# Patient Record
Sex: Female | Born: 2000 | Race: White | Hispanic: No | Marital: Single | Smoking: Never smoker
Health system: Southern US, Community
[De-identification: ages and names within clinical notes are randomized; demographics above are authoritative.]

## PROBLEM LIST (undated history)

## (undated) DIAGNOSIS — D649 Anemia, unspecified: Secondary | ICD-10-CM

## (undated) DIAGNOSIS — K219 Gastro-esophageal reflux disease without esophagitis: Secondary | ICD-10-CM

## (undated) DIAGNOSIS — F329 Major depressive disorder, single episode, unspecified: Secondary | ICD-10-CM

## (undated) DIAGNOSIS — F429 Obsessive-compulsive disorder, unspecified: Secondary | ICD-10-CM

## (undated) DIAGNOSIS — F909 Attention-deficit hyperactivity disorder, unspecified type: Secondary | ICD-10-CM

## (undated) DIAGNOSIS — F32A Depression, unspecified: Secondary | ICD-10-CM

## (undated) DIAGNOSIS — F419 Anxiety disorder, unspecified: Secondary | ICD-10-CM

## (undated) DIAGNOSIS — G4700 Insomnia, unspecified: Secondary | ICD-10-CM

---
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
## 2019-06-17 ENCOUNTER — Other Ambulatory Visit: Payer: Self-pay

## 2019-06-17 ENCOUNTER — Encounter: Payer: Self-pay | Admitting: Plastic Surgery

## 2019-06-17 ENCOUNTER — Ambulatory Visit (INDEPENDENT_AMBULATORY_CARE_PROVIDER_SITE_OTHER): Payer: 59 | Admitting: Plastic Surgery

## 2019-06-17 VITALS — BP 117/77 | HR 73 | Temp 97.5°F | Ht 67.0 in | Wt 175.8 lb

## 2019-06-17 DIAGNOSIS — N62 Hypertrophy of breast: Secondary | ICD-10-CM

## 2019-06-17 NOTE — Progress Notes (Signed)
   Referring Provider Antonietta Barcelona., MD [address],  [place] [id number]   CC:  Chief Complaint  Patient presents with  . Advice Only    (B) breast reduction      Donna Leon is an 18 y.o. female.  HPI: Patient presents to discuss breast reduction.  She has had back pain and neck pain for years.  These have not been this has not been relieved by anti-inflammatories.  She has rashes beneath her breast that she treats with powder.  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  She is around a size H now and wants to be a B or C cup.  Allergies  Allergen Reactions  . Latex Other (See Comments)    Bandage-Rash    Outpatient Encounter Medications as of 06/17/2019  Medication Sig  . buPROPion (WELLBUTRIN SR) 100 MG 12 hr tablet Take 100 mg by mouth every morning.  . clonazePAM (KLONOPIN) 0.5 MG tablet Take 0.5 mg by mouth daily as needed.  . FEROSUL 325 (65 Fe) MG tablet Take 325 mg by mouth daily.  . mirtazapine (REMERON) 15 MG tablet Take 7.5 mg by mouth at bedtime.  Marland Kitchen NYAMYC powder As directed.  Marland Kitchen omeprazole (PRILOSEC) 40 MG capsule Take 1 capsule by mouth daily.  Marland Kitchen OVER THE COUNTER MEDICATION B12-Take 1 tablet by mouth daily.  . sertraline (ZOLOFT) 50 MG tablet Take 1 tablet by mouth daily.   No facility-administered encounter medications on file as of 06/17/2019.      Past medical history significant for anxiety and depression.  Past surgical history negative.  Social History   Social History Narrative  . Not on file  Denies tobacco or illicit drug use  Review of Systems General: Denies fevers, chills, weight loss CV: Denies chest pain, shortness of breath, palpitations  Physical Exam Vitals with BMI 06/17/2019  Height 5\' 7"   Weight 175 lbs 13 oz  BMI 44.31  Systolic 540  Diastolic 77  Pulse 73    General:  No acute distress,  Alert and oriented, Non-Toxic, Normal speech and  affect Breast: She has grade 2 ptosis.  Sternal notch to nipple is 31 on the right side and 32 on the left side.  Nipple to fold is 16 on the right side and 17 on the left side.  She has no scars or masses.  The left side may be slightly bigger.  Assessment/Plan The patient has bilateral symptomatic macromastia.  She is a good candidate for a breast reduction.  The details of breast reduction surgery were discussed.  I explained the procedure in detail along the with the expected scars.  The risks were discussed in detail and include bleeding, infection, damage to surrounding structures, need for additional procedures, nipple loss, change in nipple sensation, persistent pain, contour irregularities and asymmetries.  I explained that breast feeding is often not possible after breast reduction surgery.  We discussed the expected postoperative course with an overall recovery period of about 1 month.  She demonstrated full understanding of all risks.    I anticipate approximately 600 g of tissue removed from each side.  Cindra Presume 06/17/2019, 5:52 PM

## 2019-10-09 ENCOUNTER — Other Ambulatory Visit: Payer: Self-pay

## 2019-10-09 ENCOUNTER — Telehealth: Payer: Self-pay

## 2019-10-09 DIAGNOSIS — Z524 Kidney donor: Secondary | ICD-10-CM

## 2019-10-09 NOTE — Telephone Encounter (Signed)
RECIPIENT: Milon Dikes DOB: 08/04/1979   RECIPIENT MRN: [id number]   RECIPIENT ABO: A+   PRA: POSITIVE   KIDNEY DISEASE:        DONOR ABO: A+   RELATIONSHIP: Fathers Cousin   HOW DONOR LEARNED OF NEED:        DONOR SOCIAL SECURITY #:    HEALTH INSURANCE: Yes   EMPLOYMENT: Full Time Student Religous studies and anthropology   PCP: Getting new PCP; had pediatrician   MARITAL STATUS: single   RACE: white       WT 180 lbs   HT: 5'7"   BMI: 28.2       HTN: no   DM: no   CANCER: no   RECENT TATTOOS: (3 mos) no   TOBACCO: no   RECREATIONAL DRUGS: no   ALCOHOL (# per week): no       LAST PHYSICAL: UTD   MAMMOGRAM: n/a   PAP SMEAR: n/a   COLON: n/a   OB/GYN: no       PSH:  Upper endoscopy r/t gall stones   PMH: Acid reflux;    MENTAL HEALTH: Anxiety; depression, insomnia, ADHD, OCD   HOSPITALIZATIONS:    MEDICATIONS: B12, iron, BC implant, mirtazapine, omeprazole, fluoxetine, adderall,    ALLERGIES: NKDA; latex irritation   HEPATITIS: no   UTI: no   PYELO: no   RENAL CALCULI: no   BLEEDING DISORDER; anemia   TRANSFUSIONS; no       MISC:        TRANSPLANT PSYCH: YES   REASON: Age, Mental Health       NOTES: SCREEN-No HLA d/t recipient inpatient    Mailed donor labs to:  [place]  [place], [place]

## 2019-10-17 ENCOUNTER — Telehealth: Payer: Self-pay

## 2019-10-17 NOTE — Telephone Encounter (Signed)
Potential donor was diagnosed for Covid and has been released from [facility]. She was told her TSH levels may be elevated and she was wondering if that would interfer with her doing the screening labs, writer checked with Inetta Fermo and she was told no she is good to go.

## 2019-11-04 ENCOUNTER — Encounter (HOSPITAL_COMMUNITY)
Admission: EM | Disposition: A | Discharge status: Home / Self Care | Payer: Self-pay | Source: Home / Self Care | Attending: Emergency Medicine

## 2019-11-04 ENCOUNTER — Observation Stay (HOSPITAL_COMMUNITY)
Admission: EM | Admit: 2019-11-04 | Discharge: 2019-11-06 | Disposition: A | Discharge status: Home / Self Care | Payer: 59 | Attending: Internal Medicine | Admitting: Internal Medicine

## 2019-11-04 ENCOUNTER — Emergency Department (HOSPITAL_COMMUNITY): Payer: 59 | Admitting: Certified Registered Nurse Anesthetist

## 2019-11-04 ENCOUNTER — Other Ambulatory Visit: Payer: Self-pay

## 2019-11-04 ENCOUNTER — Encounter (HOSPITAL_COMMUNITY): Payer: Self-pay

## 2019-11-04 ENCOUNTER — Emergency Department (HOSPITAL_COMMUNITY): Payer: 59

## 2019-11-04 DIAGNOSIS — K228 Other specified diseases of esophagus: Secondary | ICD-10-CM | POA: Diagnosis not present

## 2019-11-04 DIAGNOSIS — F329 Major depressive disorder, single episode, unspecified: Secondary | ICD-10-CM | POA: Insufficient documentation

## 2019-11-04 DIAGNOSIS — K2970 Gastritis, unspecified, without bleeding: Secondary | ICD-10-CM | POA: Diagnosis not present

## 2019-11-04 DIAGNOSIS — K2100 Gastro-esophageal reflux disease with esophagitis, without bleeding: Secondary | ICD-10-CM | POA: Insufficient documentation

## 2019-11-04 DIAGNOSIS — Z20822 Contact with and (suspected) exposure to covid-19: Secondary | ICD-10-CM | POA: Diagnosis not present

## 2019-11-04 DIAGNOSIS — F429 Obsessive-compulsive disorder, unspecified: Secondary | ICD-10-CM | POA: Insufficient documentation

## 2019-11-04 DIAGNOSIS — F419 Anxiety disorder, unspecified: Secondary | ICD-10-CM | POA: Diagnosis not present

## 2019-11-04 DIAGNOSIS — K920 Hematemesis: Secondary | ICD-10-CM | POA: Diagnosis present

## 2019-11-04 DIAGNOSIS — Z8616 Personal history of COVID-19: Secondary | ICD-10-CM | POA: Insufficient documentation

## 2019-11-04 DIAGNOSIS — F909 Attention-deficit hyperactivity disorder, unspecified type: Secondary | ICD-10-CM | POA: Diagnosis present

## 2019-11-04 DIAGNOSIS — K209 Esophagitis, unspecified without bleeding: Secondary | ICD-10-CM | POA: Diagnosis not present

## 2019-11-04 DIAGNOSIS — G4700 Insomnia, unspecified: Secondary | ICD-10-CM | POA: Diagnosis present

## 2019-11-04 DIAGNOSIS — Z79899 Other long term (current) drug therapy: Secondary | ICD-10-CM | POA: Diagnosis not present

## 2019-11-04 DIAGNOSIS — K2950 Unspecified chronic gastritis without bleeding: Secondary | ICD-10-CM | POA: Insufficient documentation

## 2019-11-04 DIAGNOSIS — F32A Depression, unspecified: Secondary | ICD-10-CM | POA: Diagnosis present

## 2019-11-04 HISTORY — DX: Insomnia, unspecified: G47.00

## 2019-11-04 HISTORY — DX: Anxiety disorder, unspecified: F41.9

## 2019-11-04 HISTORY — DX: Attention-deficit hyperactivity disorder, unspecified type: F90.9

## 2019-11-04 HISTORY — DX: Obsessive-compulsive disorder, unspecified: F42.9

## 2019-11-04 HISTORY — PX: BIOPSY: SHX5522

## 2019-11-04 HISTORY — PX: ESOPHAGOGASTRODUODENOSCOPY (EGD) WITH PROPOFOL: SHX5813

## 2019-11-04 HISTORY — DX: Depression, unspecified: F32.A

## 2019-11-04 HISTORY — DX: Gastro-esophageal reflux disease without esophagitis: K21.9

## 2019-11-04 HISTORY — PX: ESOPHAGOGASTRODUODENOSCOPY: SHX1529

## 2019-11-04 HISTORY — DX: Anemia, unspecified: D64.9

## 2019-11-04 LAB — CBC WITH DIFFERENTIAL/PLATELET
Abs Immature Granulocytes: 0.01 10*3/uL (ref 0.00–0.07)
Basophils Absolute: 0 10*3/uL (ref 0.0–0.1)
Basophils Relative: 1 %
Eosinophils Absolute: 0.3 10*3/uL (ref 0.0–0.5)
Eosinophils Relative: 5 %
HCT: 41.9 % (ref 36.0–46.0)
Hemoglobin: 14.2 g/dL (ref 12.0–15.0)
Immature Granulocytes: 0 %
Lymphocytes Relative: 26 %
Lymphs Abs: 1.4 10*3/uL (ref 0.7–4.0)
MCH: 30 pg (ref 26.0–34.0)
MCHC: 33.9 g/dL (ref 30.0–36.0)
MCV: 88.6 fL (ref 80.0–100.0)
Monocytes Absolute: 0.4 10*3/uL (ref 0.1–1.0)
Monocytes Relative: 8 %
Neutro Abs: 3.2 10*3/uL (ref 1.7–7.7)
Neutrophils Relative %: 60 %
Platelets: 187 10*3/uL (ref 150–400)
RBC: 4.73 MIL/uL (ref 3.87–5.11)
RDW: 11.7 % (ref 11.5–15.5)
WBC: 5.3 10*3/uL (ref 4.0–10.5)
nRBC: 0 % (ref 0.0–0.2)

## 2019-11-04 LAB — COMPREHENSIVE METABOLIC PANEL
ALT: 18 U/L (ref 0–44)
AST: 23 U/L (ref 15–41)
Albumin: 4.2 g/dL (ref 3.5–5.0)
Alkaline Phosphatase: 85 U/L (ref 38–126)
Anion gap: 10 (ref 5–15)
BUN: 12 mg/dL (ref 6–20)
CO2: 24 mmol/L (ref 22–32)
Calcium: 9.5 mg/dL (ref 8.9–10.3)
Chloride: 107 mmol/L (ref 98–111)
Creatinine, Ser: 0.8 mg/dL (ref 0.44–1.00)
GFR calc Af Amer: >60 mL/min (ref >60)
GFR calc non Af Amer: >60 mL/min (ref >60)
Glucose, Bld: 113 mg/dL — ABNORMAL HIGH (ref 70–99)
Potassium: 3.5 mmol/L (ref 3.5–5.1)
Sodium: 141 mmol/L (ref 135–145)
Total Bilirubin: 0.5 mg/dL (ref 0.3–1.2)
Total Protein: 7.1 g/dL (ref 6.5–8.1)

## 2019-11-04 LAB — TYPE AND SCREEN
ABO/RH(D): A POS
Antibody Screen: NEGATIVE

## 2019-11-04 LAB — LIPASE, BLOOD: Lipase: 23 U/L (ref 11–51)

## 2019-11-04 LAB — I-STAT BETA HCG BLOOD, ED (MC, WL, AP ONLY): I-stat hCG, quantitative: <5 m[IU]/mL (ref <5)

## 2019-11-04 LAB — HEMOGLOBIN AND HEMATOCRIT, BLOOD
HCT: 39.9 % (ref 36.0–46.0)
Hemoglobin: 13.3 g/dL (ref 12.0–15.0)

## 2019-11-04 LAB — RESPIRATORY PANEL BY RT PCR (FLU A&B, COVID)
Influenza A by PCR: NEGATIVE
Influenza B by PCR: NEGATIVE
SARS Coronavirus 2 by RT PCR: NEGATIVE

## 2019-11-04 LAB — PROTIME-INR
INR: 1.1 (ref 0.8–1.2)
Prothrombin Time: 13.7 seconds (ref 11.4–15.2)

## 2019-11-04 LAB — MAGNESIUM: Magnesium: 2 mg/dL (ref 1.7–2.4)

## 2019-11-04 SURGERY — ESOPHAGOGASTRODUODENOSCOPY (EGD) WITH PROPOFOL
Anesthesia: Monitor Anesthesia Care

## 2019-11-04 MED ORDER — SUCRALFATE 1 G PO TABS
1.0000 g | ORAL_TABLET | Freq: Three times a day (TID) | ORAL | Status: DC
  Administered 2019-11-04 – 2019-11-06 (×7): 1 g via ORAL
  Filled 2019-11-04 (×7): qty 1

## 2019-11-04 MED ORDER — MORPHINE SULFATE (PF) 4 MG/ML IV SOLN: 4.0000 mg | INTRAVENOUS | Status: DC | PRN

## 2019-11-04 MED ORDER — SODIUM CHLORIDE 0.9 % IV SOLN: INTRAVENOUS | Status: DC

## 2019-11-04 MED ORDER — MIRTAZAPINE 15 MG PO TBDP
7.5000 mg | ORAL_TABLET | Freq: Every day | ORAL | Status: DC
  Administered 2019-11-04 – 2019-11-05 (×2): 7.5 mg via ORAL
  Filled 2019-11-04 (×2): qty 0.5

## 2019-11-04 MED ORDER — MORPHINE SULFATE (PF) 4 MG/ML IV SOLN
4.0000 mg | Freq: Once | INTRAVENOUS | Status: AC | PRN
  Administered 2019-11-04: 4 mg via INTRAVENOUS
  Filled 2019-11-04: qty 1

## 2019-11-04 MED ORDER — POTASSIUM CHLORIDE IN NACL 40-0.9 MEQ/L-% IV SOLN
INTRAVENOUS | Status: AC
  Administered 2019-11-04: 17:00:00 125 mL/h via INTRAVENOUS
  Filled 2019-11-04: qty 1000

## 2019-11-04 MED ORDER — FLUOXETINE HCL 20 MG PO CAPS
60.0000 mg | ORAL_CAPSULE | Freq: Every day | ORAL | Status: DC
  Administered 2019-11-04 – 2019-11-05 (×2): 60 mg via ORAL
  Filled 2019-11-04 (×2): qty 3

## 2019-11-04 MED ORDER — PANTOPRAZOLE SODIUM 40 MG IV SOLR
40.0000 mg | Freq: Two times a day (BID) | INTRAVENOUS | Status: DC
  Administered 2019-11-04 – 2019-11-05 (×3): 40 mg via INTRAVENOUS
  Filled 2019-11-04 (×3): qty 40

## 2019-11-04 MED ORDER — PROPOFOL 10 MG/ML IV BOLUS
INTRAVENOUS | Status: DC | PRN
  Administered 2019-11-04 (×2): 50 mg via INTRAVENOUS

## 2019-11-04 MED ORDER — ONDANSETRON HCL 4 MG/2ML IJ SOLN
4.0000 mg | Freq: Once | INTRAMUSCULAR | Status: AC
  Administered 2019-11-04: 4 mg via INTRAVENOUS
  Filled 2019-11-04: qty 2

## 2019-11-04 MED ORDER — PROPOFOL 10 MG/ML IV BOLUS
INTRAVENOUS | Status: AC
  Filled 2019-11-04: qty 20

## 2019-11-04 MED ORDER — MORPHINE SULFATE (PF) 4 MG/ML IV SOLN
4.0000 mg | Freq: Once | INTRAVENOUS | Status: AC
  Administered 2019-11-04: 4 mg via INTRAVENOUS
  Filled 2019-11-04: qty 1

## 2019-11-04 MED ORDER — SODIUM CHLORIDE 0.9 % IV BOLUS
500.0000 mL | Freq: Once | INTRAVENOUS | Status: AC
  Administered 2019-11-04: 500 mL via INTRAVENOUS

## 2019-11-04 MED ORDER — PROPOFOL 10 MG/ML IV BOLUS
INTRAVENOUS | Status: AC
  Filled 2019-11-04: qty 40

## 2019-11-04 MED ORDER — LACTATED RINGERS IV SOLN
INTRAVENOUS | Status: DC
  Administered 2019-11-04: 1000 mL via INTRAVENOUS

## 2019-11-04 MED ORDER — ONDANSETRON HCL 4 MG/2ML IJ SOLN
4.0000 mg | Freq: Four times a day (QID) | INTRAMUSCULAR | Status: DC | PRN
  Administered 2019-11-04: 4 mg via INTRAVENOUS
  Filled 2019-11-04: qty 2

## 2019-11-04 MED ORDER — PANTOPRAZOLE SODIUM 40 MG IV SOLR
40.0000 mg | Freq: Once | INTRAVENOUS | Status: AC
  Administered 2019-11-04: 40 mg via INTRAVENOUS
  Filled 2019-11-04: qty 40

## 2019-11-04 MED ORDER — PROPOFOL 500 MG/50ML IV EMUL
INTRAVENOUS | Status: DC | PRN
  Administered 2019-11-04: 150 ug/kg/min via INTRAVENOUS

## 2019-11-04 MED ORDER — ONDANSETRON HCL 4 MG PO TABS
4.0000 mg | ORAL_TABLET | Freq: Four times a day (QID) | ORAL | Status: DC | PRN
  Administered 2019-11-06: 4 mg via ORAL
  Filled 2019-11-04: qty 1

## 2019-11-04 SURGICAL SUPPLY — 14 items

## 2019-11-04 NOTE — Op Note (Signed)
[facility] Patient Name: Donna Leon Procedure Date: 11/04/2019 MRN: [id number] Attending MD: Kathi Der , MD Date of Birth: Jul 28, 2001 CSN: [id number] Age: 19 Admit Type: Inpatient Procedure:                Upper GI endoscopy Indications:              Hematemesis Providers:                Kathi Der, MD, Glory Rosebush, RN, Koren Bound,                            Technician Referring MD:              Medicines:                Sedation Administered by an Anesthesia Professional Complications:            No immediate complications. Estimated Blood Loss:     Estimated blood loss was minimal. Procedure:                Pre-Anesthesia Assessment:                           - Prior to the procedure, a History and Physical                            was performed, and patient medications and                            allergies were reviewed. The patient's tolerance of                            previous anesthesia was also reviewed. The risks                            and benefits of the procedure and the sedation                            options and risks were discussed with the patient.                            All questions were answered, and informed consent                            was obtained. Prior Anticoagulants: The patient has                            taken no previous anticoagulant or antiplatelet                            agents. ASA Grade Assessment: II - A patient with                            mild systemic disease. After reviewing the risks  and benefits, the patient was deemed in                            satisfactory condition to undergo the procedure.                           After obtaining informed consent, the endoscope was                            passed under direct vision. Throughout the                            procedure, the patient's blood pressure, pulse, and                            oxygen  saturations were monitored continuously. The                            GIF-H190 ([id number]) Olympus gastroscope was                            introduced through the mouth, and advanced to the                            second part of duodenum. The upper GI endoscopy was                            accomplished without difficulty. The patient                            tolerated the procedure well. Scope In: Scope Out: Findings:      LA Grade D (one or more mucosal breaks involving at least 75% of       esophageal circumference) esophagitis with no bleeding was found in the       middle third of the esophagus. Biopsies were taken with a cold forceps       for histology.      Mucosal changes including ringed esophagus and longitudinal furrows were       found in the middle third of the esophagus and in the lower third of the       esophagus. Biopsies were taken with a cold forceps for histology.      Scattered mild inflammation characterized by congestion (edema),       erosions and erythema was found in the gastric fundus. Biopsies were       taken with a cold forceps for histology.      The cardia and gastric fundus were normal on retroflexion.      The duodenal bulb, first portion of the duodenum and second portion of       the duodenum were normal. Impression:               - LA Grade D non-erosive esophagitis with no                            bleeding. Biopsied.                           -  Esophageal mucosal changes suggestive of                            eosinophilic esophagitis. Biopsied.                           - Gastritis. Biopsied.                           - Normal duodenal bulb, first portion of the                            duodenum and second portion of the duodenum. Moderate Sedation:      Moderate (conscious) sedation was personally administered by an       anesthesia professional. The following parameters were monitored: oxygen       saturation, heart rate, blood  pressure, and response to care. Recommendation:           - Admit the patient to hospital ward for ongoing                            care.                           - Full liquid diet.                           - Use Protonix (pantoprazole) 40 mg IV BID daily.                           - Await pathology results.                           - Repeat upper endoscopy in 2 months to check                            healing. Procedure Code(s):        --- Professional ---                           [phone], Esophagogastroduodenoscopy, flexible,                            transoral; with biopsy, single or multiple Diagnosis Code(s):        --- Professional ---                           K20.80, Other esophagitis without bleeding                           K22.8, Other specified diseases of esophagus                           K29.70, Gastritis, unspecified, without bleeding                           K92.0, Hematemesis CPT copyright 2019 American Medical Association. All rights reserved.  The codes documented in this report are preliminary and upon coder review may  be revised to meet current compliance requirements. Kathi Der, MD Kathi Der, MD 11/04/2019 2:24:28 PM Number of Addenda: 0

## 2019-11-04 NOTE — Consult Note (Signed)
Referring Provider: Dr. Criss Alvine (ED) Primary Care Physician:  Myles Lipps., MD Primary Gastroenterologist: Gentry Fitz  Reason for Consultation: Hematemesis  HPI: Donna Leon is a 19 y.o. female with history of GERD and recent COVID-19 infection in February/March 2021 presenting with chief complaint of hematemesis and esophageal discomfort.  Patient states she took her medications last night but then went directly to bed instead of remaining upright.  She then noticed some discomfort in her esophagus which awoke her from sleep.  This morning, she had 6 episodes of vomiting with drops of bright red blood mixed in the emesis.  She states there was only small amount of blood.  She has never had any episodes of hematemesis prior to today.  She has not had any episodes of emesis since arriving to the ED but still feels nauseated.  She denies any known triggers of her symptoms with the exception of taking her pills and then immediately lying supine.  She denies any NSAID or aspirin use.  She takes omeprazole daily for reflux.  She states she has a history of constipation alternating with diarrhea, though she did have increased diarrhea last night.  She denies any sick contacts.  She denies any family history of esophageal, stomach, colon, cancer or other gastrointestinal malignancies  Patient has had some discomfort in her esophagus and epigastric region and had an EGD completed 02/07/2018 which was pertinent only for erythematous mucosa in the antrum (bx negative for H. pylori), small bowel biopsy completed to rule out celiac, negative.   Past Medical History:  Diagnosis Date  . ADHD   . Anemia   . Anxiety   . Depression   . GERD (gastroesophageal reflux disease)   . Insomnia   . OCD (obsessive compulsive disorder)     History reviewed. No pertinent surgical history.  Prior to Admission medications   Medication Sig Start Date End Date Taking? Authorizing Provider  buPROPion (WELLBUTRIN  SR) 100 MG 12 hr tablet Take 100 mg by mouth every morning. 05/24/19   [provider]  clonazePAM (KLONOPIN) 0.5 MG tablet Take 0.5 mg by mouth daily as needed. 02/25/19   [provider]  FEROSUL 325 (65 Fe) MG tablet Take 325 mg by mouth daily. 01/15/19   [provider]  mirtazapine (REMERON) 15 MG tablet Take 7.5 mg by mouth at bedtime. 04/13/19   [provider]  [facility] powder As directed. 06/06/19   [provider]  omeprazole (PRILOSEC) 40 MG capsule Take 1 capsule by mouth daily. 12/31/18   [provider]  OVER THE COUNTER MEDICATION B12-Take 1 tablet by mouth daily.    [provider]  sertraline (ZOLOFT) 50 MG tablet Take 1 tablet by mouth daily. 05/11/19   [provider]    Scheduled Meds: Continuous Infusions: PRN Meds:.morphine injection  Allergies as of 11/04/2019 - Review Complete 11/04/2019  Allergen Reaction Noted  . Latex Other (See Comments) 06/17/2019    History reviewed. No pertinent family history.  Social History   Socioeconomic History  . Marital status: Single    Spouse name: Not on file  . Number of children: Not on file  . Years of education: Not on file  . Highest education level: Not on file  Occupational History  . Not on file  Tobacco Use  . Smoking status: Never Smoker  . Smokeless tobacco: Never Used  Substance and Sexual Activity  . Alcohol use: Never  . Drug use: Never  . Sexual activity: Not  on file  Other Topics Concern  . Not on file  Social History Narrative  . Not on file   Social Determinants of Health   Financial Resource Strain:   . Difficulty of Paying Living Expenses:   Food Insecurity:   . Worried About Programme researcher, broadcasting/film/video in the Last Year:   . Barista in the Last Year:   Transportation Needs:   . Freight forwarder (Medical):   Marland Kitchen Lack of Transportation (Non-Medical):   Physical Activity:   . Days of Exercise per Week:   . Minutes of  Exercise per Session:   Stress:   . Feeling of Stress :   Social Connections:   . Frequency of Communication with Friends and Family:   . Frequency of Social Gatherings with Friends and Family:   . Attends Religious Services:   . Active Member of Clubs or Organizations:   . Attends Banker Meetings:   Marland Kitchen Marital Status:   Intimate Partner Violence:   . Fear of Current or Ex-Partner:   . Emotionally Abused:   Marland Kitchen Physically Abused:   . Sexually Abused:     Review of Systems: All negative except as stated above in HPI.  Physical Exam: Vital signs: Vitals:   11/04/19 1115 11/04/19 1130  BP: (!) 119/98 113/89  Pulse: 60 65  Resp: 16 (!) 0  Temp:    SpO2: 95% 99%     General:   Alert,  Well-developed, well-nourished, pleasant and cooperative in NAD Head: normocephalic, atraumatic Eyes: anicteric sclera ENT: oropharynx clear, some erythema noted surrounding the tonsils and throat. Lungs:  Clear throughout to auscultation.   No wheezes, crackles, or rhonchi. No acute distress. Heart:  Regular rate and rhythm; no murmurs, clicks, rubs,  or gallops. Abdomen: Soft, nontender, nondistended.  Normoactive bowel sounds.  No guarding.  No peritoneal signs. Rectal:  Deferred Ext: no edema  GI:  Lab Results: Recent Labs    11/04/19 0953  WBC 5.3  HGB 14.2  HCT 41.9  PLT 187   BMET Recent Labs    11/04/19 0953  NA 141  K 3.5  CL 107  CO2 24  GLUCOSE 113*  BUN 12  CREATININE 0.80  CALCIUM 9.5   LFT Recent Labs    11/04/19 0953  PROT 7.1  ALBUMIN 4.2  AST 23  ALT 18  ALKPHOS 85  BILITOT 0.5   PT/INR Recent Labs    11/04/19 0953  LABPROT 13.7  INR 1.1     Studies/Results: DG Chest Port 1 View  Result Date: 11/04/2019 CLINICAL DATA:  Vomiting blood this morning. Previous coronavirus infection. EXAM: PORTABLE CHEST 1 VIEW COMPARISON:  10/15/2019 FINDINGS: The heart size and mediastinal contours are within normal limits. Both lungs are clear. The  visualized skeletal structures are unremarkable. IMPRESSION: No active disease. Electronically Signed   By: Paulina Fusi M.D.   On: 11/04/2019 10:08    Impression/Plan: Patient is a 19 year old female presenting with hematemesis and esophageal discomfort.  CBC remains within normal limits.  Hemoglobin of 14.2.  CMP unremarkable.  Concern for pill esophagitis versus GERD.  COVID-19 test is pending, as we do not have documented history of COVID-19 infection.  If negative, we will proceed with EGD today.    EGD, including benefits and risks were thoroughly discussed with the patient.  Patient was given the opportunity to ask questions.  Patient would like to proceed with the procedure today.  If EGD is within  normal limits, patient can be discharged from a GI standpoint.   LOS: 0 days   Salley Slaughter  11/04/2019, 11:56 AM  Questions please call [phone]

## 2019-11-04 NOTE — ED Provider Notes (Signed)
[facility]-EMERGENCY DEPT Provider Note   CSN: [id number] Arrival date & time: 11/04/19  0165     History Chief Complaint  Patient presents with  . Hematemesis    Donna Leon is a 19 y.o. female.  HPI 19 year old female presents with vomiting blood.  Started off with chest burning which she has had before and resulted in her getting an EGD in 2019 at [facility].  She states that this burning type pain lasted for about 45 minutes and then she started noticing vomiting blood.  When she first vomited there were chunks that she assumes were blood clots.  She has vomited 3 times initially and then 3 times when she got here prior to coming indoors.  Each time was blood.  She feels shaky but otherwise denies specific lightheadedness.  No abdominal pain but she is a little nauseated and her chest feels like a 7 out of 10 pain.  She has continued on omeprazole.  No NSAIDs, aspirin, or alcohol abuse.  EGD 2019: Impression:       - Z-line regular, 39 cm from the incisors.           - Erythematous mucosa in the antrum. Biopsied.           - No gross lesions in the entire examined duodenum.            Biopsied.  Past Medical History:  Diagnosis Date  . ADHD   . Anemia   . Anxiety   . Depression   . GERD (gastroesophageal reflux disease)   . Insomnia   . OCD (obsessive compulsive disorder)     Patient Active Problem List   Diagnosis Date Noted  . Esophagitis with gastritis 11/04/2019  . Depression   . ADHD     Past Surgical History:  Procedure Laterality Date  . ESOPHAGOGASTRODUODENOSCOPY  11/04/2019   Dr. Kathi Der     OB History   No obstetric history on file.     History reviewed. No pertinent family history.  Social History   Tobacco Use  . Smoking status: Never Smoker  . Smokeless tobacco: Never Used  Substance Use Topics  . Alcohol use: Never  . Drug use: Never    Home Medications Prior to Admission  medications   Medication Sig Start Date End Date Taking? Authorizing Provider  amphetamine-dextroamphetamine (ADDERALL) 20 MG tablet Take 20 mg by mouth in the morning and at bedtime.   Yes [provider]  FEROSUL 325 (65 Fe) MG tablet Take 325 mg by mouth daily. 01/15/19  Yes [provider]  FLUoxetine (PROZAC) 20 MG capsule Take 60 mg by mouth daily.   Yes [provider]  mirtazapine (REMERON) 15 MG tablet Take 7.5 mg by mouth at bedtime. 04/13/19  Yes [provider]  [facility] powder As directed. 06/06/19  Yes [provider]  omeprazole (PRILOSEC) 40 MG capsule Take 1 capsule by mouth daily. 12/31/18  Yes [provider]  OVER THE COUNTER MEDICATION B12-Take 1 tablet by mouth daily.   Yes [provider]  buPROPion (WELLBUTRIN SR) 100 MG 12 hr tablet Take 100 mg by mouth every morning. 05/24/19   [provider]  clonazePAM (KLONOPIN) 0.5 MG tablet Take 0.5 mg by mouth daily as needed. 02/25/19   [provider]  sertraline (ZOLOFT) 50 MG tablet Take 1 tablet by mouth daily. 05/11/19   [provider]    Allergies    Latex  Review of Systems  Review of Systems  Respiratory: Negative for shortness of breath.   Cardiovascular: Positive for chest pain.  Gastrointestinal: Positive for vomiting. Negative for abdominal pain.  Neurological: Negative for light-headedness.  All other systems reviewed and are negative.   Physical Exam Updated Vital Signs BP 115/83   Pulse 74   Temp 98.2 F (36.8 C) (Axillary)   Resp 19   Ht 5\' 7"  (1.702 m)   Wt 81.6 kg   LMP 10/21/2019 (Approximate) Comment: irregular menses, pt states she has nexplanon  SpO2 99%   BMI 28.19 kg/m   Physical Exam Vitals and nursing note reviewed.  Constitutional:      General: She is in acute distress (uncomfortable, in pain).     Appearance: She is well-developed. She is not ill-appearing or diaphoretic.  HENT:     Head:  Normocephalic and atraumatic.     Right Ear: External ear normal.     Left Ear: External ear normal.     Nose: Nose normal.     Mouth/Throat:     Pharynx: Oropharynx is clear.  Eyes:     General:        Right eye: No discharge.        Left eye: No discharge.  Cardiovascular:     Rate and Rhythm: Normal rate and regular rhythm.     Heart sounds: Normal heart sounds.  Pulmonary:     Effort: Pulmonary effort is normal.     Breath sounds: Normal breath sounds.  Abdominal:     General: There is no distension.     Palpations: Abdomen is soft.     Tenderness: There is no abdominal tenderness.  Skin:    General: Skin is warm and dry.  Neurological:     Mental Status: She is alert.  Psychiatric:        Mood and Affect: Mood is not anxious.     ED Results / Procedures / Treatments   Labs (all labs ordered are listed, but only abnormal results are displayed) Labs Reviewed  COMPREHENSIVE METABOLIC PANEL - Abnormal; Notable for the following components:      Result Value   Glucose, Bld 113 (*)    All other components within normal limits  RESPIRATORY PANEL BY RT PCR (FLU A&B, COVID)  LIPASE, BLOOD  CBC WITH DIFFERENTIAL/PLATELET  PROTIME-INR  HEMOGLOBIN AND HEMATOCRIT, BLOOD  MAGNESIUM  I-STAT BETA HCG BLOOD, ED ([facility], [facility], [facility] ONLY)  TYPE AND SCREEN  ABO/RH  SURGICAL PATHOLOGY    EKG EKG Interpretation  Date/Time:  Monday November 04 2019 10:04:57 EDT Ventricular Rate:  68 PR Interval:    QRS Duration: 89 QT Interval:  418 QTC Calculation: 445 R Axis:   61 Text Interpretation: Sinus rhythm Borderline short PR interval Borderline T wave abnormalities No old tracing to compare Confirmed by [id number] [phone]) on 11/04/2019 10:07:13 AM   Radiology DG Chest Port 1 View  Result Date: 11/04/2019 CLINICAL DATA:  Vomiting blood this morning. Previous coronavirus infection. EXAM: PORTABLE CHEST 1 VIEW COMPARISON:  10/15/2019 FINDINGS: The heart size and mediastinal contours are  within normal limits. Both lungs are clear. The visualized skeletal structures are unremarkable. IMPRESSION: No active disease. Electronically Signed   By: 10/17/2019 M.D.   On: 11/04/2019 10:08    Procedures Procedures (including critical care time)  Medications Ordered in ED Medications  pantoprazole (PROTONIX) injection 40 mg (has no administration in time range)  sucralfate (CARAFATE) tablet 1 g (has no administration in time  range)  0.9 % NaCl with KCl 40 mEq / L  infusion (has no administration in time range)  ondansetron (ZOFRAN) tablet 4 mg (has no administration in time range)    Or  ondansetron (ZOFRAN) injection 4 mg (has no administration in time range)  morphine 4 MG/ML injection 4 mg (has no administration in time range)  ondansetron (ZOFRAN) injection 4 mg (4 mg Intravenous Given 11/04/19 0955)  sodium chloride 0.9 % bolus 500 mL (0 mLs Intravenous Stopped 11/04/19 1132)  morphine 4 MG/ML injection 4 mg (4 mg Intravenous Given 11/04/19 0955)  pantoprazole (PROTONIX) injection 40 mg (40 mg Intravenous Given 11/04/19 0954)  morphine 4 MG/ML injection 4 mg (4 mg Intravenous Given 11/04/19 1233)    ED Course  I have reviewed the triage vital signs and the nursing notes.  Pertinent labs & imaging results that were available during my care of the patient were reviewed by me and considered in my medical decision making (see chart for details).  Clinical Course as of Nov 03 1516  Mon Nov 04, 2019  1135 D/w Dr. Alessandra Bevels who states he will likely do an EGD today, possibly D/C home if negative. Asks for covid testing and he'll look at his schedule.   [SG]    Clinical Course User Index [SG] Sherwood Gambler, MD   MDM Rules/Calculators/A&P                      Gastroenterology took to for EGD and this showed significant esophagitis.  Bad enough that he recommends Protonix twice daily, Carafate, and liquid diet.  Recommends this by hospitalist service.  Discussed with Dr. Olevia Bowens who  will admit.  Donna Leon was evaluated in Emergency Department on 11/04/2019 for the symptoms described in the history of present illness. She was evaluated in the context of the global COVID-19 pandemic, which necessitated consideration that the patient might be at risk for infection with the SARS-CoV-2 virus that causes COVID-19. Institutional protocols and algorithms that pertain to the evaluation of patients at risk for COVID-19 are in a state of rapid change based on information released by regulatory bodies including the CDC and federal and state organizations. These policies and algorithms were followed during the patient's care in the ED.  Final Clinical Impression(s) / ED Diagnoses Final diagnoses:  Hematemesis  Esophagitis    Rx / DC Orders ED Discharge Orders    None       Sherwood Gambler, MD 11/04/19 [phone]

## 2019-11-04 NOTE — H&P (Signed)
History and Physical    Sheriden Archibeque AVW:[id number] DOB: [birth year] DOA: 11/04/2019  PCP: Myles Lipps., MD   Patient coming from: Home.  I have personally briefly reviewed patient's old medical records in [facility]  Chief Complaint: Vomiting blood and burning esophagus.  HPI: Donna Leon is a 19 y.o. female with medical history significant of ADHD, anxiety, depression, insomnia, OCD, anemia, GERD who is coming to the emergency department due to having burning pain in her esophagus Diastat after she took her medications followed by 3 episodes of hematemesis containing clots.  She has a previous history of GERD/similar GI issues and had an endoscopy in 2019 at [facility].  She has chronic diarrhea alternating with constipation.  She denies melena or hematochezia.  She denies smoking, alcohol use, caffeine or carbonated drinks consumption.  She does not use prescribed or OTC NSAIDs.  She denies fever, chills, preprocedure sore throat, rhinorrhea, dyspnea, wheezing, hemoptysis, chest pain, palpitations, dizziness, diaphoresis, PND, orthopnea or pitting edema of the lower extremities.  She denies dysuria, frequency hematuria.  No polyuria, polydipsia, polyphagia or blurred vision.  ED Course: Initial vital signs temperature 97.9 F, pulse 82, respiration 18, blood pressure 131/90 mmHg and O2 sat 97% on room air.  The patient received a 500 mL bolus, 40 mg of Protonix IVP x1, Zofran 4 mg IVP x1 and morphine 4 mg IVP x2.    GI was consulted and gastroenterology performed a stat EGD, which showed severe LA grade D esophagitis in the mid esophagus with findings concerning for significant esophagitis.  There was thick mucosa covering the midesophagus.  There was mild gastritis in the fundus.  GI recommended postprocedure observation and recommendations (please see GI brief operative note for further details).  CBC was normal with a white count of 5.3, hemoglobin 14.2 g/dL and platelets 829.  PT was  13.7 seconds and INR 1.1.  I-STAT hCG was normal.  CMP shows nonfasting glucose of 113 mg/dL, but was otherwise normal.  Lipase was normal.  SARS and influenza by PCR was negative.  At one view chest radiograph did not show any active cardiopulmonary disease.  Review of Systems: As per HPI otherwise 10 point review of systems negative.   Past Medical History:  Diagnosis Date  . ADHD   . Anemia   . Anxiety   . Depression   . GERD (gastroesophageal reflux disease)   . Insomnia   . OCD (obsessive compulsive disorder)     Past Surgical History:  Procedure Laterality Date  . BIOPSY  11/04/2019   Procedure: BIOPSY;  Surgeon: Kathi Der, MD;  Location: [facility] END[facility];  Service: Gastroenterology;;  . ESOPHAGOGASTRODUODENOSCOPY  11/04/2019   Dr. Kathi Der  . ESOPHAGOGASTRODUODENOSCOPY (EGD) WITH PROPOFOL N/A 11/04/2019   Procedure: ESOPHAGOGASTRODUODENOSCOPY (EGD) WITH PROPOFOL;  Surgeon: Kathi Der, MD;  Location: [facility] ENDOSCOPY;  Service: Gastroenterology;  Laterality: N/A;     reports that she has never smoked. She has never used smokeless tobacco. She reports that she does not drink alcohol or use drugs.  Allergies  Allergen Reactions  . Latex Other (See Comments)    Bandage-Rash    Family History  Problem Relation Age of Onset  . Insomnia Mother   . GER disease Father   . OCD Father   . Hypertension Father   . Anxiety disorder Father   . Obesity Father   . OCD Sister   . Anxiety disorder Sister   . Anemia Sister   . Depression Sister   .  Depression Maternal Grandfather   . Heart disease Maternal Grandfather   . Cancer Maternal Grandfather        Unspecified Leukemia or lymphoma  . GER disease Paternal Grandmother   . Anxiety disorder Paternal Grandmother   . Hiatal hernia Paternal Grandmother   . Hypertension Maternal Grandmother   . Insomnia Maternal Grandmother   . Depression Maternal Grandmother   . Diabetes Mellitus II Maternal Grandmother   .  Hypertension Paternal Grandfather   . Heart attack Paternal Grandfather   . Heart disease Paternal Grandfather   . Obesity Paternal Grandfather    Prior to Admission medications   Medication Sig Start Date End Date Taking? Authorizing Provider  amphetamine-dextroamphetamine (ADDERALL) 20 MG tablet Take 20 mg by mouth in the morning and at bedtime.   Yes [provider]  FEROSUL 325 (65 Fe) MG tablet Take 325 mg by mouth daily. 01/15/19  Yes [provider]  FLUoxetine (PROZAC) 20 MG capsule Take 60 mg by mouth daily.   Yes [provider]  mirtazapine (REMERON) 15 MG tablet Take 7.5 mg by mouth at bedtime. 04/13/19  Yes [provider]  [facility] powder As directed. 06/06/19  Yes [provider]  omeprazole (PRILOSEC) 40 MG capsule Take 1 capsule by mouth daily. 12/31/18  Yes [provider]  OVER THE COUNTER MEDICATION B12-Take 1 tablet by mouth daily.   Yes [provider]  buPROPion (WELLBUTRIN SR) 100 MG 12 hr tablet Take 100 mg by mouth every morning. 05/24/19   [provider]  clonazePAM (KLONOPIN) 0.5 MG tablet Take 0.5 mg by mouth daily as needed. 02/25/19   [provider]  sertraline (ZOLOFT) 50 MG tablet Take 1 tablet by mouth daily. 05/11/19   [provider]    Physical Exam: Vitals:   11/04/19 1430 11/04/19 1440 11/04/19 1447 11/04/19 1448  BP: 110/61 95/76 115/83   Pulse: 86 73  74  Resp: 16 19    Temp:      TempSrc:      SpO2: 100% 100%  99%  Weight:      Height:        Constitutional: NAD, calm, comfortable Eyes: PERRL, lids and conjunctivae normal ENMT: Mucous membranes are moist. Posterior pharynx clear of any exudate or lesions.Normal dentition.  Neck: normal, supple, no masses, no thyromegaly Respiratory: clear to auscultation bilaterally, no wheezing, no crackles. Normal respiratory effort. No accessory muscle use.  Cardiovascular: Regular rate and rhythm, no murmurs / rubs /  gallops. No extremity edema. 2+ pedal pulses. No carotid bruits.  Abdomen: Nondistended.  Soft, mild epigastric/substernal tenderness, no guarding or rebound, no masses palpated. No hepatosplenomegaly. Bowel sounds positive.  Musculoskeletal: no clubbing / cyanosis.  Good ROM, no contractures. Normal muscle tone.  Skin: no significant rashes, lesions, ulcers on limited dermatological examination. Neurologic: CN 2-12 grossly intact. Sensation intact, DTR normal. Strength 5/5 in all 4.  Psychiatric: Normal judgment and insight. Alert and oriented x 3. Normal mood.    Labs on Admission: I have personally reviewed following labs and imaging studies  CBC: Recent Labs  Lab 11/04/19 0953  WBC 5.3  NEUTROABS 3.2  HGB 14.2  HCT 41.9  MCV 88.6  PLT 568   Basic Metabolic Panel: Recent Labs  Lab 11/04/19 0953  NA 141  K 3.5  CL 107  CO2 24  GLUCOSE 113*  BUN 12  CREATININE 0.80  CALCIUM 9.5   GFR: Estimated Creatinine Clearance: 123.3 mL/min (by C-G formula based  on SCr of 0.8 mg/dL). Liver Function Tests: Recent Labs  Lab 11/04/19 0953  AST 23  ALT 18  ALKPHOS 85  BILITOT 0.5  PROT 7.1  ALBUMIN 4.2   Recent Labs  Lab 11/04/19 0953  LIPASE 23   No results for input(s): AMMONIA in the last 168 hours. Coagulation Profile: Recent Labs  Lab 11/04/19 0953  INR 1.1   Cardiac Enzymes: No results for input(s): CKTOTAL, CKMB, CKMBINDEX, TROPONINI in the last 168 hours. BNP (last 3 results) No results for input(s): PROBNP in the last 8760 hours. HbA1C: No results for input(s): HGBA1C in the last 72 hours. CBG: No results for input(s): GLUCAP in the last 168 hours. Lipid Profile: No results for input(s): CHOL, HDL, LDLCALC, TRIG, CHOLHDL, LDLDIRECT in the last 72 hours. Thyroid Function Tests: No results for input(s): TSH, T4TOTAL, FREET4, T3FREE, THYROIDAB in the last 72 hours. Anemia Panel: No results for input(s): VITAMINB12, FOLATE, FERRITIN, TIBC, IRON,  RETICCTPCT in the last 72 hours. Urine analysis: No results found for: COLORURINE, APPEARANCEUR, LABSPEC, PHURINE, GLUCOSEU, HGBUR, BILIRUBINUR, KETONESUR, PROTEINUR, UROBILINOGEN, NITRITE, LEUKOCYTESUR  Radiological Exams on Admission: DG Chest Port 1 View  Result Date: 11/04/2019 CLINICAL DATA:  Vomiting blood this morning. Previous coronavirus infection. EXAM: PORTABLE CHEST 1 VIEW COMPARISON:  10/15/2019 FINDINGS: The heart size and mediastinal contours are within normal limits. Both lungs are clear. The visualized skeletal structures are unremarkable. IMPRESSION: No active disease. Electronically Signed   By: Paulina Fusi M.D.   On: 11/04/2019 10:08    EKG: Independently reviewed.  Vent. rate 68 BPM PR interval * ms QRS duration 89 ms QT/QTc 418/445 ms P-R-T axes 53 61 11 Sinus rhythm Borderline short PR interval Borderline T wave abnormalities No previous ECG to compare to  Assessment/Plan Principal Problem:   Esophagitis with gastritis Observation/MedSurg. Full liquid diet. Switch regular meds to liquid form. Protonix 40 mg IVP every 12 hours. Continue sucralfate 1 g p.o. 4 times daily. Follow-up CBC in a.m. Outpatient GI follow-up in 2 months.  Active Problems:   Depression Continue fluoxetine 60 mg p.o. at bedtime.    ADHD Hold Adderall for now.    Insomnia Continue mirtazapine 7.5 mg p.o. at bedtime.   DVT prophylaxis: SCDs. Code Status: Full code. Family Communication: Disposition Plan: Post 24 to 48 hours EGD/hematemesis observation. Consults called: Gastroenterology Dr. Levora Angel.  (see consult note). Admission status: Observation/MedSurg.   Bobette Mo MD [facility]  If 7PM-7AM, please contact night-coverage [email or web address]  11/04/2019, 3:40 PM   This document was prepared using Dragon voice recognition software and may contain some unintended transcription errors.

## 2019-11-04 NOTE — Plan of Care (Signed)

## 2019-11-04 NOTE — Anesthesia Preprocedure Evaluation (Addendum)
Anesthesia Evaluation  Patient identified by MRN, date of birth, ID band Patient awake    Reviewed: Allergy & Precautions, H&P , NPO status , Patient's Chart, lab work & pertinent test results, reviewed documented beta blocker date and time   History of Anesthesia Complications Negative for: history of anesthetic complications  Airway Mallampati: I  TM Distance: >3 FB Neck ROM: full    Dental no notable dental hx. (+) Chipped, Poor Dentition,    Pulmonary neg pulmonary ROS,    Pulmonary exam normal breath sounds clear to auscultation       Cardiovascular Exercise Tolerance: Good negative cardio ROS   Rhythm:regular Rate:Normal     Neuro/Psych Anxiety Depression negative neurological ROS     GI/Hepatic Neg liver ROS, GERD  Medicated,  Endo/Other  negative endocrine ROS  Renal/GU negative Renal ROS  negative genitourinary   Musculoskeletal   Abdominal   Peds  Hematology  (+) Blood dyscrasia, anemia ,   Anesthesia Other Findings   Reproductive/Obstetrics negative OB ROS                           Anesthesia Physical Anesthesia Plan  ASA: II and emergent  Anesthesia Plan: MAC   Post-op Pain Management:    Induction: Intravenous  PONV Risk Score and Plan:   Airway Management Planned: Mask and Natural Airway  Additional Equipment:   Intra-op Plan:   Post-operative Plan:   Informed Consent: I have reviewed the patients History and Physical, chart, labs and discussed the procedure including the risks, benefits and alternatives for the proposed anesthesia with the patient or authorized representative who has indicated his/her understanding and acceptance.     Dental Advisory Given  Plan Discussed with: CRNA and Anesthesiologist  Anesthesia Plan Comments:         Anesthesia Quick Evaluation

## 2019-11-04 NOTE — Progress Notes (Signed)
ED TO INPATIENT HANDOFF REPORT  Name/Age/Gender Donna Leon 19 y.o. female  Code Status    Code Status Orders  (From admission, onward)         Start     Ordered   11/04/19 1459  Full code  Continuous     11/04/19 1500        Code Status History    This patient has a current code status but no historical code status.   Advance Care Planning Activity      Home/SNF/Other Home  Chief Complaint Hematemesis [K92.0] Esophagitis with gastritis [K29.70, K20.90]  Level of Care/Admitting Diagnosis ED Disposition    ED Disposition Condition Comment   Admit  Hospital Area: [facility] [[id number]]  Level of Care: Med-Surg [16]  Covid Evaluation: Asymptomatic Screening Protocol (No Symptoms)  Diagnosis: Esophagitis with gastritis [4008676]  Admitting Physician: Reubin Milan [[id number]]  Attending Physician: Reubin Milan [[id number]]       Medical History Past Medical History:  Diagnosis Date  . ADHD   . Anemia   . Anxiety   . Depression   . GERD (gastroesophageal reflux disease)   . Insomnia   . OCD (obsessive compulsive disorder)     Allergies Allergies  Allergen Reactions  . Latex Other (See Comments)    Bandage-Rash    IV Location/Drains/Wounds Patient Lines/Drains/Airways Status   Active Line/Drains/Airways    Name:   Placement date:   Placement time:   Site:   Days:   Peripheral IV 11/04/19 Left Antecubital   11/04/19    0952    Antecubital   less than 1          Labs/Imaging Results for orders placed or performed during the hospital encounter of 11/04/19 (from the past 48 hour(s))  ABO/Rh     Status: None (Preliminary result)   Collection Time: 11/04/19  9:35 AM  Result Value Ref Range   ABO/RH(D)      A POS Performed at [facility], [place] [address] [place], [place] [address]   Comprehensive metabolic panel     Status: Abnormal   Collection Time: 11/04/19  9:53 AM  Result Value Ref Range   Sodium 141 135 - 145 mmol/L   Potassium 3.5 3.5 - 5.1 mmol/L   Chloride 107 98 - 111 mmol/L   CO2 24 22 - 32 mmol/L   Glucose, Bld 113 (H) 70 - 99 mg/dL    Comment: Glucose reference range applies only to samples taken after fasting for at least 8 hours.   BUN 12 6 - 20 mg/dL   Creatinine, Ser 0.80 0.44 - 1.00 mg/dL   Calcium 9.5 8.9 - 10.3 mg/dL   Total Protein 7.1 6.5 - 8.1 g/dL   Albumin 4.2 3.5 - 5.0 g/dL   AST 23 15 - 41 U/L   ALT 18 0 - 44 U/L   Alkaline Phosphatase 85 38 - 126 U/L   Total Bilirubin 0.5 0.3 - 1.2 mg/dL   GFR calc non Af Amer >60 >60 mL/min   GFR calc Af Amer >60 >60 mL/min   Anion gap 10 5 - 15    Comment: Performed at [facility], [place] [address], [place], [place] [address]  Lipase, blood     Status: None   Collection Time: 11/04/19  9:53 AM  Result Value Ref Range   Lipase 23 11 - 51 U/L    Comment: Performed at [facility], [place]  [address], [place], [place] [address]  CBC with Differential     Status: None   Collection Time: 11/04/19  9:53 AM  Result Value Ref Range   WBC 5.3 4.0 - 10.5 K/uL   RBC 4.73 3.87 - 5.11 MIL/uL   Hemoglobin 14.2 12.0 - 15.0 g/dL   HCT 60.4 54.0 - 98.1 %   MCV 88.6 80.0 - 100.0 fL   MCH 30.0 26.0 - 34.0 pg   MCHC 33.9 30.0 - 36.0 g/dL   RDW 19.1 47.8 - 29.5 %   Platelets 187 150 - 400 K/uL   nRBC 0.0 0.0 - 0.2 %   Neutrophils Relative % 60 %   Neutro Abs 3.2 1.7 - 7.7 K/uL   Lymphocytes Relative 26 %   Lymphs Abs 1.4 0.7 - 4.0 K/uL   Monocytes Relative 8 %   Monocytes Absolute 0.4 0.1 - 1.0 K/uL   Eosinophils Relative 5 %   Eosinophils Absolute 0.3 0.0 - 0.5 K/uL   Basophils Relative 1 %   Basophils Absolute 0.0 0.0 - 0.1 K/uL   Immature Granulocytes 0 %   Abs Immature Granulocytes 0.01 0.00 - 0.07 K/uL    Comment: Performed at [facility], [address], [place], [place] [address]  Protime-INR     Status: None   Collection Time: 11/04/19  9:53 AM  Result  Value Ref Range   Prothrombin Time 13.7 11.4 - 15.2 seconds   INR 1.1 0.8 - 1.2    Comment: (NOTE) INR goal varies based on device and disease states. Performed at [facility], [address] [place], [place] [address]   Type and screen [facility]     Status: None   Collection Time: 11/04/19  9:53 AM  Result Value Ref Range   ABO/RH(D) A POS    Antibody Screen NEG    Sample Expiration      11/07/2019,2359 Performed at [facility], [address] [place], [place] [address]   I-Stat beta hCG blood, ED     Status: None   Collection Time: 11/04/19 10:01 AM  Result Value Ref Range   I-stat hCG, quantitative <5.0 <5 mIU/mL   Comment 3            Comment:   GEST. AGE      CONC.  (mIU/mL)   <=1 WEEK        5 - 50     2 WEEKS       50 - 500     3 WEEKS       100 - 10,000     4 WEEKS     1,000 - 30,000        FEMALE AND NON-PREGNANT FEMALE:     LESS THAN 5 mIU/mL   Respiratory Panel by RT PCR (Flu A&B, Covid) - Nasopharyngeal Swab     Status: None   Collection Time: 11/04/19 11:51 AM   Specimen: Nasopharyngeal Swab  Result Value Ref Range   SARS Coronavirus 2 by RT PCR NEGATIVE NEGATIVE    Comment: (NOTE) SARS-CoV-2 target nucleic acids are NOT DETECTED. The SARS-CoV-2 RNA is generally detectable in upper respiratoy specimens during the acute phase of infection. The lowest concentration of SARS-CoV-2 viral copies this assay can detect is 131 copies/mL. A negative result does not preclude SARS-Cov-2 infection and should not be used as the sole basis for treatment or other patient management decisions. A negative result may occur with  improper specimen collection/handling, submission of specimen other than nasopharyngeal swab, presence of viral mutation(s) within the areas targeted by this assay, and inadequate number of viral copies (<131 copies/mL). A negative result must be combined with clinical observations, patient  history, and epidemiological information. The expected result is Negative. Fact Sheet for Patients:  https://www.moore.com/ Fact Sheet for Healthcare Providers:  https://www.young.biz/ This test is not yet ap proved or cleared by the [place] FDA and  has been authorized for detection and/or diagnosis of SARS-CoV-2 by FDA under an Emergency Use Authorization (EUA). This EUA will remain  in effect (meaning this test can be used) for the duration of the COVID-19 declaration under Section 564(b)(1) of the Act, 21 U.S.C. section 360bbb-3(b)(1), unless the authorization is terminated or revoked sooner.    Influenza A by PCR NEGATIVE NEGATIVE   Influenza B by PCR NEGATIVE NEGATIVE    Comment: (NOTE) The Xpert Xpress SARS-CoV-2/FLU/RSV assay is intended as an aid in  the diagnosis of influenza from Nasopharyngeal swab specimens and  should not be used as a sole basis for treatment. Nasal washings and  aspirates are unacceptable for Xpert Xpress SARS-CoV-2/FLU/RSV  testing. Fact Sheet for Patients: https://www.moore.com/ Fact Sheet for Healthcare Providers: https://www.young.biz/ This test is not yet approved or cleared by the [place] FDA and  has been authorized for detection and/or diagnosis of SARS-CoV-2 by  FDA under an Emergency Use Authorization (EUA). This EUA will remain  in effect (meaning this test can be used) for the duration of the  Covid-19 declaration under Section 564(b)(1) of the Act, 21  U.S.C. section 360bbb-3(b)(1), unless the authorization is  terminated or revoked. Performed at [facility], [address] [place], [place] [address]    DG Chest Port 1 View  Result Date: 11/04/2019 CLINICAL DATA:  Vomiting blood this morning. Previous coronavirus infection. EXAM: PORTABLE CHEST 1 VIEW COMPARISON:  10/15/2019 FINDINGS: The heart size and mediastinal contours are  within normal limits. Both lungs are clear. The visualized skeletal structures are unremarkable. IMPRESSION: No active disease. Electronically Signed   By: Paulina Fusi M.D.   On: 11/04/2019 10:08    Pending Labs Unresulted Labs (From admission, onward)    Start     Ordered   11/05/19 0500  HIV Antibody (routine testing w rflx)  (HIV Antibody (Routine testing w reflex) panel)  Tomorrow morning,   R     11/04/19 1500   11/05/19 0500  CBC  Tomorrow morning,   R     11/04/19 1500   11/04/19 1600  Hemoglobin and hematocrit, blood  Once,   STAT     11/04/19 1457   11/04/19 1459  Magnesium  Add-on,   AD     11/04/19 1458          Vitals/Pain Today's Vitals   11/04/19 1500 11/04/19 1515 11/04/19 1530 11/04/19 1545  BP: 108/80 103/70 113/74 114/77  Pulse: 70 84  (!) 58  Resp:      Temp:      TempSrc:      SpO2: 99% 99%  100%  Weight:      Height:      PainSc:        Isolation Precautions No active isolations  Medications Medications  pantoprazole (PROTONIX) injection 40 mg (has no administration in time range)  sucralfate (CARAFATE) tablet 1 g (has no administration in time range)  0.9 % NaCl with KCl 40 mEq / L  infusion (has no administration in  time range)  ondansetron (ZOFRAN) tablet 4 mg (has no administration in time range)    Or  ondansetron (ZOFRAN) injection 4 mg (has no administration in time range)  morphine 4 MG/ML injection 4 mg (has no administration in time range)  FLUoxetine (PROZAC) 20 MG/5ML solution 60 mg (has no administration in time range)  mirtazapine (REMERON SOL-TAB) disintegrating tablet 7.5 mg (has no administration in time range)  ondansetron (ZOFRAN) injection 4 mg (4 mg Intravenous Given 11/04/19 0955)  sodium chloride 0.9 % bolus 500 mL (0 mLs Intravenous Stopped 11/04/19 1132)  morphine 4 MG/ML injection 4 mg (4 mg Intravenous Given 11/04/19 0955)  pantoprazole (PROTONIX) injection 40 mg (40 mg Intravenous Given 11/04/19 0954)  morphine 4 MG/ML  injection 4 mg (4 mg Intravenous Given 11/04/19 1233)    Mobility walks

## 2019-11-04 NOTE — ED Triage Notes (Signed)
Pt presents with c/o hematemesis that started this morning. Pt reports feeling some burning in her esophagus, c/o acid reflux yesterday that has gotten much worse today.

## 2019-11-04 NOTE — Anesthesia Procedure Notes (Signed)
Procedure Name: MAC Date/Time: 11/04/2019 2:00 PM Performed by: Claudia Desanctis, CRNA Pre-anesthesia Checklist: Patient identified, Emergency Drugs available, Suction available and Patient being monitored Patient Re-evaluated:Patient Re-evaluated prior to induction Oxygen Delivery Method: Simple face mask

## 2019-11-04 NOTE — Transfer of Care (Signed)
Immediate Anesthesia Transfer of Care Note  Patient: Donna Leon  Procedure(s) Performed: ESOPHAGOGASTRODUODENOSCOPY (EGD) WITH PROPOFOL (N/A ) BIOPSY  Patient Location: Endoscopy Unit  Anesthesia Type:MAC  Level of Consciousness: drowsy  Airway & Oxygen Therapy: Patient Spontanous Breathing and Patient connected to face mask  Post-op Assessment: Report given to RN and Post -op Vital signs reviewed and stable  Post vital signs: Reviewed and stable  Last Vitals:  Vitals Value Taken Time  BP    Temp    Pulse 81 11/04/19 1423  Resp 23 11/04/19 1423  SpO2 97 % 11/04/19 1423  Vitals shown include unvalidated device data.  Last Pain:  Vitals:   11/04/19 1355  TempSrc: Oral  PainSc: 0-No pain         Complications: No apparent anesthesia complications

## 2019-11-04 NOTE — Brief Op Note (Addendum)
11/04/2019  2:24 PM  PATIENT:  Donna Leon  18 y.o. female  PRE-OPERATIVE DIAGNOSIS:  hematemesis  POST-OPERATIVE DIAGNOSIS:  gastric and esophageal biopsies  PROCEDURE:  Procedure(s): ESOPHAGOGASTRODUODENOSCOPY (EGD) WITH PROPOFOL (N/A) BIOPSY  SURGEON:  Surgeon(s) and Role:    * Mariluz Crespo, MD - Primary  Findings ----------- -EGD showed severe LA grade D esophagitis in the midesophagus with finding concerning for eosinophilic esophagitis.  There was thick mucosa covering the midesophagus.  Not able to wash with lavage. -Mild gastritis in the fundus  Recommendations ----------------------- -Recommend admitting patient for observation. -Okay to have full liquid diet - advance as tolerated to soft -Start IV twice daily PPI -Start Carafate -Repeat CBC in the morning -Follow path results -Recommend repeat EGD in 2 months to document healing of esophagitis -GI will follow  Kathi Der MD, FACP 11/04/2019, 2:26 PM  Contact #  [phone]

## 2019-11-05 ENCOUNTER — Other Ambulatory Visit: Payer: Self-pay

## 2019-11-05 DIAGNOSIS — F320 Major depressive disorder, single episode, mild: Secondary | ICD-10-CM | POA: Diagnosis not present

## 2019-11-05 DIAGNOSIS — K920 Hematemesis: Principal | ICD-10-CM

## 2019-11-05 DIAGNOSIS — K2970 Gastritis, unspecified, without bleeding: Secondary | ICD-10-CM | POA: Diagnosis not present

## 2019-11-05 DIAGNOSIS — F900 Attention-deficit hyperactivity disorder, predominantly inattentive type: Secondary | ICD-10-CM

## 2019-11-05 DIAGNOSIS — G4709 Other insomnia: Secondary | ICD-10-CM

## 2019-11-05 DIAGNOSIS — K2900 Acute gastritis without bleeding: Secondary | ICD-10-CM

## 2019-11-05 DIAGNOSIS — K209 Esophagitis, unspecified without bleeding: Secondary | ICD-10-CM | POA: Diagnosis not present

## 2019-11-05 LAB — CBC
HCT: 40 % (ref 36.0–46.0)
Hemoglobin: 13.1 g/dL (ref 12.0–15.0)
MCH: 29.8 pg (ref 26.0–34.0)
MCHC: 32.8 g/dL (ref 30.0–36.0)
MCV: 91.1 fL (ref 80.0–100.0)
Platelets: 178 10*3/uL (ref 150–400)
RBC: 4.39 MIL/uL (ref 3.87–5.11)
RDW: 11.8 % (ref 11.5–15.5)
WBC: 5.1 10*3/uL (ref 4.0–10.5)
nRBC: 0 % (ref 0.0–0.2)

## 2019-11-05 LAB — COMPREHENSIVE METABOLIC PANEL
ALT: 14 U/L (ref 0–44)
AST: 19 U/L (ref 15–41)
Albumin: 3.7 g/dL (ref 3.5–5.0)
Alkaline Phosphatase: 81 U/L (ref 38–126)
Anion gap: 8 (ref 5–15)
BUN: 9 mg/dL (ref 6–20)
CO2: 24 mmol/L (ref 22–32)
Calcium: 9.3 mg/dL (ref 8.9–10.3)
Chloride: 109 mmol/L (ref 98–111)
Creatinine, Ser: 0.88 mg/dL (ref 0.44–1.00)
GFR calc Af Amer: >60 mL/min (ref >60)
GFR calc non Af Amer: >60 mL/min (ref >60)
Glucose, Bld: 91 mg/dL (ref 70–99)
Potassium: 4 mmol/L (ref 3.5–5.1)
Sodium: 141 mmol/L (ref 135–145)
Total Bilirubin: 0.4 mg/dL (ref 0.3–1.2)
Total Protein: 6.3 g/dL — ABNORMAL LOW (ref 6.5–8.1)

## 2019-11-05 LAB — PHOSPHORUS: Phosphorus: 4.1 mg/dL (ref 2.5–4.6)

## 2019-11-05 LAB — MAGNESIUM: Magnesium: 2.2 mg/dL (ref 1.7–2.4)

## 2019-11-05 LAB — HIV ANTIBODY (ROUTINE TESTING W REFLEX): HIV Screen 4th Generation wRfx: NONREACTIVE

## 2019-11-05 LAB — ABO/RH: ABO/RH(D): A POS

## 2019-11-05 LAB — SURGICAL PATHOLOGY

## 2019-11-05 MED ORDER — ACETAMINOPHEN 650 MG RE SUPP: 650.0000 mg | Freq: Four times a day (QID) | RECTAL | Status: DC | PRN

## 2019-11-05 MED ORDER — ACETAMINOPHEN 325 MG PO TABS
650.0000 mg | ORAL_TABLET | Freq: Four times a day (QID) | ORAL | Status: DC | PRN
  Administered 2019-11-05: 650 mg via ORAL
  Filled 2019-11-05: qty 2

## 2019-11-05 NOTE — Progress Notes (Signed)
PROGRESS NOTE    Donna Leon  IRW:[id number] DOB: Dec 14, 2000 DOA: 11/04/2019 PCP: Antonietta Barcelona., MD     Brief Narrative:  19 y.o. female PMHx  ADHD, Anxiety, Depression, Insomnia, OCD, Anemia, GERD   Coming to the emergency department due to having burning pain in her esophagus Diastat after she took her medications followed by 3 episodes of hematemesis containing clots.  She has a previous history of GERD/similar GI issues and had an endoscopy in 2019 at [facility].  She has chronic diarrhea alternating with constipation.  She denies melena or hematochezia.  She denies smoking, alcohol use, caffeine or carbonated drinks consumption.  She does not use prescribed or OTC NSAIDs.  She denies fever, chills, preprocedure sore throat, rhinorrhea, dyspnea, wheezing, hemoptysis, chest pain, palpitations, dizziness, diaphoresis, PND, orthopnea or pitting edema of the lower extremities.  She denies dysuria, frequency hematuria.  No polyuria, polydipsia, polyphagia or blurred vision.  ED Course: Initial vital signs temperature 97.9 F, pulse 82, respiration 18, blood pressure 131/90 mmHg and O2 sat 97% on room air.  The patient received a 500 mL bolus, 40 mg of Protonix IVP x1, Zofran 4 mg IVP x1 and morphine 4 mg IVP x2.    GI was consulted and gastroenterology performed a stat EGD, which showed severe LA grade D esophagitis in the mid esophagus with findings concerning for significant esophagitis.  There was thick mucosa covering the midesophagus.  There was mild gastritis in the fundus.  GI recommended postprocedure observation and recommendations (please see GI brief operative note for further details).  CBC was normal with a white count of 5.3, hemoglobin 14.2 g/dL and platelets 187.  PT was 13.7 seconds and INR 1.1.  I-STAT hCG was normal.  CMP shows nonfasting glucose of 113 mg/dL, but was otherwise normal.  Lipase was normal.  SARS and influenza by PCR was negative.  At one view chest radiograph did  not show any active cardiopulmonary disease.   Subjective: A/O x4, positive nausea postprandial, negative vomiting, negative S OB  Assessment & Plan:   Principal Problem:   Esophagitis with gastritis Active Problems:   Depression   ADHD   Insomnia   Severe Esophagitis with mild Gastritis -Per GI report patient most likely has underlying eosinophilic esophagitis. -Pathology report pending -Protonix 40 mg BID -Carafate 1 g TID x2 months -Schedule follow-up appointment with Dr. Otis Brace for 6 weeks post discharge.    Depression Continue fluoxetine 60 mg p.o. at bedtime.    ADHD Hold Adderall for now.    Insomnia Continue mirtazapine 7.5 mg p.o. at bedtime.     DVT prophylaxis: SCD Code Status: Full Family Communication: 4/6 grandmother present for discussion of plan of care all questions answered Disposition Plan:  1.  Where the patient is from 2.  Anticipated d/c place. 3.  Barriers to d/c discharge in a.m. if patient has no further episodes of nausea, vomiting postprandial.  Ensure appointments are scheduled prior to discharge   Consultants:  GI Dr. Alessandra Bevels   Procedures/Significant Events:  4/5 EGD;showed severe LA grade D esophagitis in the midesophagus with finding concerning for eosinophilic esophagitis.  There was thick mucosa covering the midesophagus.  Not able to wash with lavage. -Mild gastritis in the fundus    I have personally reviewed and interpreted all radiology studies and my findings are as above.  VENTILATOR SETTINGS:    Cultures 4/5 EGD biopsy pending  Antimicrobials:    Devices    LINES / TUBES:  Continuous Infusions:   Objective: Vitals:   11/05/19 0028 11/05/19 0428 11/05/19 1416 11/05/19 2040  BP: 104/71 106/66 111/69 131/81  Pulse: (!) 57 (!) 51 83 72  Resp: 18 16 20 16   Temp: 98.3 F (36.8 C) 98.2 F (36.8 C) 98.3 F (36.8 C) 98.9 F (37.2 C)  TempSrc:      SpO2: 98% 99% 98% 97%  Weight:       Height:       No intake or output data in the 24 hours ending 11/06/19 0022 Filed Weights   11/04/19 1355  Weight: 81.6 kg    Examination:  General: A/O x4 no acute respiratory distress Eyes: negative scleral hemorrhage, negative anisocoria, negative icterus ENT: Negative Runny nose, negative gingival bleeding, Neck:  Negative scars, masses, torticollis, lymphadenopathy, JVD Lungs: Clear to auscultation bilaterally without wheezes or crackles Cardiovascular: Regular rate and rhythm without murmur gallop or rub normal S1 and S2 Abdomen: negative abdominal pain, nondistended, positive soft, bowel sounds, no rebound, no ascites, no appreciable mass Extremities: No significant cyanosis, clubbing, or edema bilateral lower extremities Skin: Negative rashes, lesions, ulcers Psychiatric:  Negative depression, negative anxiety, negative fatigue, negative mania  Central nervous system:  Cranial nerves II through XII intact, tongue/uvula midline, all extremities muscle strength 5/5, sensation intact throughout,  negative dysarthria, negative expressive aphasia, negative receptive aphasia.  .     Data Reviewed: Care during the described time interval was provided by me .  I have reviewed this patient's available data, including medical history, events of note, physical examination, and all test results as part of my evaluation.  CBC: Recent Labs  Lab 11/04/19 0953 11/04/19 1651 11/05/19 0538  WBC 5.3  --  5.1  NEUTROABS 3.2  --   --   HGB 14.2 13.3 13.1  HCT 41.9 39.9 40.0  MCV 88.6  --  91.1  PLT 187  --  178   Basic Metabolic Panel: Recent Labs  Lab 11/04/19 0953 11/04/19 1002 11/05/19 0957  NA 141  --  141  K 3.5  --  4.0  CL 107  --  109  CO2 24  --  24  GLUCOSE 113*  --  91  BUN 12  --  9  CREATININE 0.80  --  0.88  CALCIUM 9.5  --  9.3  MG  --  2.0 2.2  PHOS  --   --  4.1   GFR: Estimated Creatinine Clearance: 112 mL/min (by C-G formula based on SCr of 0.88  mg/dL). Liver Function Tests: Recent Labs  Lab 11/04/19 0953 11/05/19 0957  AST 23 19  ALT 18 14  ALKPHOS 85 81  BILITOT 0.5 0.4  PROT 7.1 6.3*  ALBUMIN 4.2 3.7   Recent Labs  Lab 11/04/19 0953  LIPASE 23   No results for input(s): AMMONIA in the last 168 hours. Coagulation Profile: Recent Labs  Lab 11/04/19 0953  INR 1.1   Cardiac Enzymes: No results for input(s): CKTOTAL, CKMB, CKMBINDEX, TROPONINI in the last 168 hours. BNP (last 3 results) No results for input(s): PROBNP in the last 8760 hours. HbA1C: No results for input(s): HGBA1C in the last 72 hours. CBG: No results for input(s): GLUCAP in the last 168 hours. Lipid Profile: No results for input(s): CHOL, HDL, LDLCALC, TRIG, CHOLHDL, LDLDIRECT in the last 72 hours. Thyroid Function Tests: No results for input(s): TSH, T4TOTAL, FREET4, T3FREE, THYROIDAB in the last 72 hours. Anemia Panel: No results for input(s): VITAMINB12, FOLATE, FERRITIN, TIBC, IRON,  RETICCTPCT in the last 72 hours. Sepsis Labs: No results for input(s): PROCALCITON, LATICACIDVEN in the last 168 hours.  Recent Results (from the past 240 hour(s))  Respiratory Panel by RT PCR (Flu A&B, Covid) - Nasopharyngeal Swab     Status: None   Collection Time: 11/04/19 11:51 AM   Specimen: Nasopharyngeal Swab  Result Value Ref Range Status   SARS Coronavirus 2 by RT PCR NEGATIVE NEGATIVE Final    Comment: (NOTE) SARS-CoV-2 target nucleic acids are NOT DETECTED. The SARS-CoV-2 RNA is generally detectable in upper respiratoy specimens during the acute phase of infection. The lowest concentration of SARS-CoV-2 viral copies this assay can detect is 131 copies/mL. A negative result does not preclude SARS-Cov-2 infection and should not be used as the sole basis for treatment or other patient management decisions. A negative result may occur with  improper specimen collection/handling, submission of specimen other than nasopharyngeal swab, presence of  viral mutation(s) within the areas targeted by this assay, and inadequate number of viral copies (<131 copies/mL). A negative result must be combined with clinical observations, patient history, and epidemiological information. The expected result is Negative. Fact Sheet for Patients:  https://www.moore.com/ Fact Sheet for Healthcare Providers:  https://www.young.biz/ This test is not yet ap proved or cleared by the [place] FDA and  has been authorized for detection and/or diagnosis of SARS-CoV-2 by FDA under an Emergency Use Authorization (EUA). This EUA will remain  in effect (meaning this test can be used) for the duration of the COVID-19 declaration under Section 564(b)(1) of the Act, 21 U.S.C. section 360bbb-3(b)(1), unless the authorization is terminated or revoked sooner.    Influenza A by PCR NEGATIVE NEGATIVE Final   Influenza B by PCR NEGATIVE NEGATIVE Final    Comment: (NOTE) The Xpert Xpress SARS-CoV-2/FLU/RSV assay is intended as an aid in  the diagnosis of influenza from Nasopharyngeal swab specimens and  should not be used as a sole basis for treatment. Nasal washings and  aspirates are unacceptable for Xpert Xpress SARS-CoV-2/FLU/RSV  testing. Fact Sheet for Patients: https://www.moore.com/ Fact Sheet for Healthcare Providers: https://www.young.biz/ This test is not yet approved or cleared by the [place] FDA and  has been authorized for detection and/or diagnosis of SARS-CoV-2 by  FDA under an Emergency Use Authorization (EUA). This EUA will remain  in effect (meaning this test can be used) for the duration of the  Covid-19 declaration under Section 564(b)(1) of the Act, 21  U.S.C. section 360bbb-3(b)(1), unless the authorization is  terminated or revoked. Performed at [facility], [address], [place], [place] [address]          Radiology  Studies: [facility] Chest Port 1 View  Result Date: 11/04/2019 CLINICAL DATA:  Vomiting blood this morning. Previous coronavirus infection. EXAM: PORTABLE CHEST 1 VIEW COMPARISON:  10/15/2019 FINDINGS: The heart size and mediastinal contours are within normal limits. Both lungs are clear. The visualized skeletal structures are unremarkable. IMPRESSION: No active disease. Electronically Signed   By: Paulina Fusi M.D.   On: 11/04/2019 10:08        Scheduled Meds: . FLUoxetine  60 mg Oral QHS  . mirtazapine  7.5 mg Oral QHS  . pantoprazole (PROTONIX) IV  40 mg Intravenous Q12H  . sucralfate  1 g Oral TID WC & HS   Continuous Infusions:   LOS: 0 days    Time spent:40 min    Anaelle Dunton, Roselind Messier, MD [facility] Pager [phone]  If 7PM-7AM, please contact  night-coverage [email or web address] Password [facility] 11/06/2019, 12:22 AM

## 2019-11-05 NOTE — Progress Notes (Signed)
Pt c/o headache 2/10. Pt requesting Tylenol none ordered. On call notified. Will continue to monitor.

## 2019-11-05 NOTE — Anesthesia Postprocedure Evaluation (Signed)
Anesthesia Post Note  Patient: Donna Leon  Procedure(s) Performed: ESOPHAGOGASTRODUODENOSCOPY (EGD) WITH PROPOFOL (N/A ) BIOPSY     Patient location during evaluation: PACU Anesthesia Type: MAC Level of consciousness: awake and alert Pain management: pain level controlled Vital Signs Assessment: post-procedure vital signs reviewed and stable Respiratory status: spontaneous breathing, nonlabored ventilation, respiratory function stable and patient connected to nasal cannula oxygen Cardiovascular status: stable and blood pressure returned to baseline Postop Assessment: no apparent nausea or vomiting Anesthetic complications: no    Last Vitals:  Vitals:   11/05/19 0028 11/05/19 0428  BP: 104/71 106/66  Pulse: (!) 57 (!) 51  Resp: 18 16  Temp: 36.8 C 36.8 C  SpO2: 98% 99%    Last Pain:  Vitals:   11/04/19 1910  TempSrc:   PainSc: 0-No pain   Pain Goal:                   Donna Leon

## 2019-11-05 NOTE — Progress Notes (Signed)
[facility] Gastroenterology Progress Note  Donna Leon 19 y.o. 2001-04-13  CC: Odynophagia   Subjective: Patient underwent EGD yesterday which showed severe esophagitis with concern for eosinophilic esophagitis.  There was also mild gastritis present in the fundus.    Patient states she is feeling a little bit better today, though she still has pain with swallowing.  She also has chest discomfort with swallowing but none at rest.  Denies further episodes of vomiting but still has some nausea.  She tolerated a liquid breakfast this morning without any issues.  She denies any abdominal pain.  She has not had a bowel movement since prior to admission but does not feel constipated.  ROS : Review of Systems  Respiratory: Negative for cough and shortness of breath.   Cardiovascular: Negative for chest pain and palpitations.  Gastrointestinal: Positive for nausea. Negative for abdominal pain, blood in stool, heartburn, melena and vomiting.     Objective: Vital signs in last 24 hours: Vitals:   11/05/19 0028 11/05/19 0428  BP: 104/71 106/66  Pulse: (!) 57 (!) 51  Resp: 18 16  Temp: 98.3 F (36.8 C) 98.2 F (36.8 C)  SpO2: 98% 99%    Physical Exam:  General:  Alert, cooperative, no distress, appears stated age  Head:  Normocephalic, without obvious abnormality, atraumatic  Eyes:  Anicteric sclera, EOM's intact  Lungs:   Clear to auscultation bilaterally, respirations unlabored  Heart:  Regular rate and rhythm, S1, S2 normal  Abdomen:   Soft, non-tender, bowel sounds active all four quadrants,  no masses,   Extremities: Extremities normal, atraumatic, no  edema  Pulses: 2+ and symmetric    Lab Results: Recent Labs    11/04/19 0953 11/04/19 1002 11/05/19 0957  NA 141  --  141  K 3.5  --  4.0  CL 107  --  109  CO2 24  --  24  GLUCOSE 113*  --  91  BUN 12  --  9  CREATININE 0.80  --  0.88  CALCIUM 9.5  --  9.3  MG  --  2.0 2.2  PHOS  --   --  4.1   Recent Labs     11/04/19 0953 11/05/19 0957  AST 23 19  ALT 18 14  ALKPHOS 85 81  BILITOT 0.5 0.4  PROT 7.1 6.3*  ALBUMIN 4.2 3.7   Recent Labs    11/04/19 0953 11/04/19 0953 11/04/19 1651 11/05/19 0538  WBC 5.3  --   --  5.1  NEUTROABS 3.2  --   --   --   HGB 14.2   < > 13.3 13.1  HCT 41.9   < > 39.9 40.0  MCV 88.6  --   --  91.1  PLT 187  --   --  178   < > = values in this interval not displayed.   Recent Labs    11/04/19 0953  LABPROT 13.7  INR 1.1      Assessment -Severe grade D esophagitis with findings concerning for eosinophilic esophagitis (bx pending) -Mild gastritis (bx pending)   Plan: -Continue IV PPI twice daily with transition to p.o. PPI at discharge. -Continue Carafate 3 times daily -Discussed importance of continuing these medications as an outpatient -If eosinophilic esophagitis is present, advised avoidance of dairy, wheat, soy, eggs, nut, seafood/shellfish -Educated patient on avoidance of acidic foods (citrus, tomato-based foods), caffeine, alcohol.  Advised patient to avoid recumbency for 3 hours after eating.  Recommended elevation of head of  bed or use of wedge pillow. -Patient to follow-up with [facility] for repeat endoscopy in 2 months. -If patient tolerates soft diet, okay to discharge from GI standpoint.  Edrick Kins PA-C 11/05/2019, 11:27 AM  Contact #  [phone]

## 2019-11-06 DIAGNOSIS — K2970 Gastritis, unspecified, without bleeding: Secondary | ICD-10-CM | POA: Diagnosis not present

## 2019-11-06 DIAGNOSIS — F900 Attention-deficit hyperactivity disorder, predominantly inattentive type: Secondary | ICD-10-CM | POA: Diagnosis not present

## 2019-11-06 DIAGNOSIS — K209 Esophagitis, unspecified without bleeding: Secondary | ICD-10-CM | POA: Diagnosis not present

## 2019-11-06 DIAGNOSIS — F320 Major depressive disorder, single episode, mild: Secondary | ICD-10-CM | POA: Diagnosis not present

## 2019-11-06 LAB — COMPREHENSIVE METABOLIC PANEL
ALT: 16 U/L (ref 0–44)
AST: 17 U/L (ref 15–41)
Albumin: 3.8 g/dL (ref 3.5–5.0)
Alkaline Phosphatase: 80 U/L (ref 38–126)
Anion gap: 10 (ref 5–15)
BUN: 10 mg/dL (ref 6–20)
CO2: 25 mmol/L (ref 22–32)
Calcium: 9.4 mg/dL (ref 8.9–10.3)
Chloride: 105 mmol/L (ref 98–111)
Creatinine, Ser: 0.85 mg/dL (ref 0.44–1.00)
GFR calc Af Amer: >60 mL/min (ref >60)
GFR calc non Af Amer: >60 mL/min (ref >60)
Glucose, Bld: 97 mg/dL (ref 70–99)
Potassium: 3.8 mmol/L (ref 3.5–5.1)
Sodium: 140 mmol/L (ref 135–145)
Total Bilirubin: 0.7 mg/dL (ref 0.3–1.2)
Total Protein: 6.6 g/dL (ref 6.5–8.1)

## 2019-11-06 LAB — CBC
HCT: 41.2 % (ref 36.0–46.0)
Hemoglobin: 14 g/dL (ref 12.0–15.0)
MCH: 30.5 pg (ref 26.0–34.0)
MCHC: 34 g/dL (ref 30.0–36.0)
MCV: 89.8 fL (ref 80.0–100.0)
Platelets: 200 10*3/uL (ref 150–400)
RBC: 4.59 MIL/uL (ref 3.87–5.11)
RDW: 11.7 % (ref 11.5–15.5)
WBC: 5.3 10*3/uL (ref 4.0–10.5)
nRBC: 0 % (ref 0.0–0.2)

## 2019-11-06 LAB — MAGNESIUM: Magnesium: 2 mg/dL (ref 1.7–2.4)

## 2019-11-06 MED ORDER — PANTOPRAZOLE SODIUM 40 MG PO TBEC: 40.0000 mg | DELAYED_RELEASE_TABLET | Freq: Two times a day (BID) | ORAL | 1 refills | Status: AC

## 2019-11-06 MED ORDER — ONDANSETRON HCL 4 MG PO TABS: 4.0000 mg | ORAL_TABLET | Freq: Four times a day (QID) | ORAL | 0 refills | Status: AC | PRN

## 2019-11-06 MED ORDER — SUCRALFATE 1 G PO TABS: 1.0000 g | ORAL_TABLET | Freq: Three times a day (TID) | ORAL | 1 refills | Status: AC

## 2019-11-06 NOTE — Progress Notes (Signed)
-  Patient seen at bedside.  Feeling comfortable.  Tolerating diet.  Biopsy findings of possible pill induced esophagitis discussed.  Recommendations ----------------------- -Okay to discharge from GI standpoint. -pantoprazole 40 mg twice a day and Carafate 1 g 3 times a day for 2 months. -Follow-up in GI clinic in 4 to 6 weeks. -Recommend repeat EGD in 2 months to document healing of esophagitis -GI will sign off.  Call us back if needed  Kathi Der MD, FACP 11/06/2019, 10:51 AM  Contact #  [phone]

## 2019-11-06 NOTE — Discharge Summary (Signed)
Physician Discharge Summary  Donna Leon AJG:[id number] DOB: 08/03/2000 DOA: 11/04/2019  PCP: Myles Lipps., MD  Admit date: 11/04/2019 Discharge date: 11/06/2019  Admitted From: Home  Discharge disposition: Home   Recommendations for Outpatient Follow-Up:   . Follow up with GI in 4 to 6 weeks. . Patient will need repeat endoscopy in 2 months as per GI. Marland Kitchen Avoid over-the-counter pain medication including Motrin Aleve.  Discharge Diagnosis:   Principal Problem:   Esophagitis with gastritis Active Problems:   Depression   ADHD   Insomnia   Discharge Condition: Improved.  Diet recommendation: .  Soft diet-as tolerated.  Stick on bland for few days.  Wound care: None.  Code status: Full.   History of Present Illness:   Patient is a 19 years old female with history of GERD-endoscopy in 2019, depression, ADHD, insomnia presented to hospital with complaints of burning sensation in her esophagus, 3 pills of hematemesis containing clots.  He does have history of chronic diarrhea with constipation as well. ED Course:Initial vital signs temperature 97.9 F, pulse 82, respiration 18, blood pressure 131/90 mmHg and O2 sat 97% on room air. The patient received a 500 mL bolus, 40 mg of Protonix IVP x1, Zofran 4 mg IVP x1 and morphine 4 mg IVP x2. GI was consulted and gastroenterology performed a stat EGD,which showed severe LA grade D esophagitis in the mid esophagus with findings concerning for significant esophagitis. There was thick mucosa covering the midesophagus. There was mild gastritis in the fundus.   Laboratory data showed: CBC was normal with a white count of 5.3, hemoglobin 14.2 g/dL and platelets 355. PT was 13.7 seconds and INR 1.1. I-STAT hCG was normal. CMP shows nonfasting glucose of 113 mg/dL, but was otherwise normal. Lipase was normal. SARS and influenza by PCR was negative. One view chest radiograph did not show any active cardiopulmonary disease.GI  recommended postprocedure observation.  Hospital Course:   Following conditions were addressed during hospitalization as listed below,  Severe Esophagitis with mild Gastritis Was followed by GI prior to discharge.  Biopsy with possibility of pill induced esophagitis.  GI recommended Protonix 40 mg twice a day and Carafate 1 g 3 times a day for at least 2 months.  Patient will have to follow-up with GI clinic in 4 to 6 weeks.  Patient will need repeat EGD in 2 months to document healing of esophagitis.  Depression Continue fluoxetine   ADHD Resume Adderall  Insomnia Continue mirtazapine 7.5 mg p.o. at bedtime.  Disposition.  At this time, patient is stable for disposition home.  Medical Consultants:    GI Dr. Levora Angel  Procedures:    4/5 EGD: showed severeLA grade Desophagitis in the midesophagus with finding concerning for eosinophilic esophagitis. There was thick mucosa covering the midesophagus. Not able to wash with lavage.-Mild gastritis in the fundus  Subjective:   Today, patient complains of mild nausea.  Has tolerated soft diet.  Has been seen by GI.  Discharge Exam:   Vitals:   11/05/19 2040 11/06/19 0503  BP: 131/81 105/61  Pulse: 72 (!) 52  Resp: 16 16  Temp: 98.9 F (37.2 C) 98.3 F (36.8 C)  SpO2: 97% 96%   Vitals:   11/05/19 0428 11/05/19 1416 11/05/19 2040 11/06/19 0503  BP: 106/66 111/69 131/81 105/61  Pulse: (!) 51 83 72 (!) 52  Resp: 16 20 16 16   Temp: 98.2 F (36.8 C) 98.3 F (36.8 C) 98.9 F (37.2 C) 98.3 F (36.8 C)  TempSrc:      SpO2: 99% 98% 97% 96%  Weight:      Height:        General: Alert awake, not in obvious distress HENT: pupils equally reacting to light,  No scleral pallor or icterus noted. Oral mucosa is moist.  Chest:  Clear breath sounds.  Diminished breath sounds bilaterally. No crackles or wheezes.  CVS: S1 &S2 heard. No murmur.  Regular rate and rhythm. Abdomen: Soft, nontender, nondistended.  Bowel  sounds are heard.   Extremities: No cyanosis, clubbing or edema.  Peripheral pulses are palpable. Psych: Alert, awake and oriented, normal mood CNS:  No cranial nerve deficits.  Power equal in all extremities.   Skin: Warm and dry.  No rashes noted.  The results of significant diagnostics from this hospitalization (including imaging, microbiology, ancillary and laboratory) are listed below for reference.     Diagnostic Studies:   DG Chest Port 1 View  Result Date: 11/04/2019 CLINICAL DATA:  Vomiting blood this morning. Previous coronavirus infection. EXAM: PORTABLE CHEST 1 VIEW COMPARISON:  10/15/2019 FINDINGS: The heart size and mediastinal contours are within normal limits. Both lungs are clear. The visualized skeletal structures are unremarkable. IMPRESSION: No active disease. Electronically Signed   By: Nelson Chimes M.D.   On: 11/04/2019 10:08     Labs:   Basic Metabolic Panel: Recent Labs  Lab 11/04/19 0953 11/04/19 0953 11/04/19 1002 11/05/19 0957 11/06/19 0600  NA 141  --   --  141 140  K 3.5   < >  --  4.0 3.8  CL 107  --   --  109 105  CO2 24  --   --  24 25  GLUCOSE 113*  --   --  91 97  BUN 12  --   --  9 10  CREATININE 0.80  --   --  0.88 0.85  CALCIUM 9.5  --   --  9.3 9.4  MG  --   --  2.0 2.2 2.0  PHOS  --   --   --  4.1  --    < > = values in this interval not displayed.   GFR Estimated Creatinine Clearance: 116 mL/min (by C-G formula based on SCr of 0.85 mg/dL). Liver Function Tests: Recent Labs  Lab 11/04/19 0953 11/05/19 0957 11/06/19 0600  AST 23 19 17   ALT 18 14 16   ALKPHOS 85 81 80  BILITOT 0.5 0.4 0.7  PROT 7.1 6.3* 6.6  ALBUMIN 4.2 3.7 3.8   Recent Labs  Lab 11/04/19 0953  LIPASE 23   No results for input(s): AMMONIA in the last 168 hours. Coagulation profile Recent Labs  Lab 11/04/19 0953  INR 1.1    CBC: Recent Labs  Lab 11/04/19 0953 11/04/19 1651 11/05/19 0538 11/06/19 0600  WBC 5.3  --  5.1 5.3  NEUTROABS 3.2  --    --   --   HGB 14.2 13.3 13.1 14.0  HCT 41.9 39.9 40.0 41.2  MCV 88.6  --  91.1 89.8  PLT 187  --  178 200   Cardiac Enzymes: No results for input(s): CKTOTAL, CKMB, CKMBINDEX, TROPONINI in the last 168 hours. BNP: Invalid input(s): POCBNP CBG: No results for input(s): GLUCAP in the last 168 hours. D-Dimer No results for input(s): DDIMER in the last 72 hours. Hgb A1c No results for input(s): HGBA1C in the last 72 hours. Lipid Profile No results for input(s): CHOL, HDL, LDLCALC, TRIG, CHOLHDL, LDLDIRECT in  the last 72 hours. Thyroid function studies No results for input(s): TSH, T4TOTAL, T3FREE, THYROIDAB in the last 72 hours.  Invalid input(s): FREET3 Anemia work up No results for input(s): VITAMINB12, FOLATE, FERRITIN, TIBC, IRON, RETICCTPCT in the last 72 hours. Microbiology Recent Results (from the past 240 hour(s))  Respiratory Panel by RT PCR (Flu A&B, Covid) - Nasopharyngeal Swab     Status: None   Collection Time: 11/04/19 11:51 AM   Specimen: Nasopharyngeal Swab  Result Value Ref Range Status   SARS Coronavirus 2 by RT PCR NEGATIVE NEGATIVE Final    Comment: (NOTE) SARS-CoV-2 target nucleic acids are NOT DETECTED. The SARS-CoV-2 RNA is generally detectable in upper respiratoy specimens during the acute phase of infection. The lowest concentration of SARS-CoV-2 viral copies this assay can detect is 131 copies/mL. A negative result does not preclude SARS-Cov-2 infection and should not be used as the sole basis for treatment or other patient management decisions. A negative result may occur with  improper specimen collection/handling, submission of specimen other than nasopharyngeal swab, presence of viral mutation(s) within the areas targeted by this assay, and inadequate number of viral copies (<131 copies/mL). A negative result must be combined with clinical observations, patient history, and epidemiological information. The expected result is Negative. Fact Sheet  for Patients:  https://www.moore.com/ Fact Sheet for Healthcare Providers:  https://www.young.biz/ This test is not yet ap proved or cleared by the [place] FDA and  has been authorized for detection and/or diagnosis of SARS-CoV-2 by FDA under an Emergency Use Authorization (EUA). This EUA will remain  in effect (meaning this test can be used) for the duration of the COVID-19 declaration under Section 564(b)(1) of the Act, 21 U.S.C. section 360bbb-3(b)(1), unless the authorization is terminated or revoked sooner.    Influenza A by PCR NEGATIVE NEGATIVE Final   Influenza B by PCR NEGATIVE NEGATIVE Final    Comment: (NOTE) The Xpert Xpress SARS-CoV-2/FLU/RSV assay is intended as an aid in  the diagnosis of influenza from Nasopharyngeal swab specimens and  should not be used as a sole basis for treatment. Nasal washings and  aspirates are unacceptable for Xpert Xpress SARS-CoV-2/FLU/RSV  testing. Fact Sheet for Patients: https://www.moore.com/ Fact Sheet for Healthcare Providers: https://www.young.biz/ This test is not yet approved or cleared by the [place] FDA and  has been authorized for detection and/or diagnosis of SARS-CoV-2 by  FDA under an Emergency Use Authorization (EUA). This EUA will remain  in effect (meaning this test can be used) for the duration of the  Covid-19 declaration under Section 564(b)(1) of the Act, 21  U.S.C. section 360bbb-3(b)(1), unless the authorization is  terminated or revoked. Performed at [facility], [address], [place], [place] [address]      Discharge Instructions:   Discharge Instructions    Call MD for:   Complete by: As directed    Worsening symptoms   Diet general   Complete by: As directed    Bland diet for few days.   Discharge instructions   Complete by: As directed    Take medications as prescribed.  Follow-up with GI  Dr.Brahambhatt in 4 to 6 weeks.  You will likely need endoscopy in 2 months.   Increase activity slowly   Complete by: As directed      Allergies as of 11/06/2019      Reactions   Latex Other (See Comments)   Bandage-Rash      Medication List    STOP taking these  medications   buPROPion 100 MG 12 hr tablet Commonly known as: WELLBUTRIN SR   clonazePAM 0.5 MG tablet Commonly known as: KLONOPIN   omeprazole 40 MG capsule Commonly known as: PRILOSEC   sertraline 50 MG tablet Commonly known as: ZOLOFT     TAKE these medications   Adderall XR 10 MG 24 hr capsule Generic drug: amphetamine-dextroamphetamine Take 10 mg by mouth 2 (two) times daily.   FeroSul 325 (65 FE) MG tablet Generic drug: ferrous sulfate Take 325 mg by mouth every other day.   FLUoxetine 20 MG capsule Commonly known as: PROZAC Take 60 mg by mouth at bedtime.   mirtazapine 15 MG tablet Commonly known as: REMERON Take 7.5 mg by mouth at bedtime.   Nexplanon 68 MG Impl implant Generic drug: etonogestrel 1 each by Subdermal route once.   Nyamyc powder Generic drug: nystatin Apply 1 application topically daily as needed (rash). As directed.   ondansetron 4 MG tablet Commonly known as: ZOFRAN Take 1 tablet (4 mg total) by mouth every 6 (six) hours as needed for nausea.   OVER THE COUNTER MEDICATION Take 1 tablet by mouth every other day. Vitamin B12   pantoprazole 40 MG tablet Commonly known as: Protonix Take 1 tablet (40 mg total) by mouth 2 (two) times daily before a meal.   sucralfate 1 g tablet Commonly known as: CARAFATE Take 1 tablet (1 g total) by mouth 3 (three) times daily before meals.      Follow-up Information    Brahmbhatt, Parag, MD. Schedule an appointment as soon as possible for a visit in 6 week(s).   Specialty: Gastroenterology Why: Follow-up for esophagitis Contact information: [address] [place] [address] [phone]           Time  coordinating discharge: 39 minutes  Signed:  Jaydee Conran  [facility] 11/06/2019, 10:58 AM

## 2020-05-22 ENCOUNTER — Other Ambulatory Visit: Payer: Self-pay

## 2020-10-17 IMAGING — DX DG CHEST 1V PORT
1 series · 1 of 1 positions shown · non-contrast
Comparison: 10/15/2019

CLINICAL DATA: Vomiting blood this morning. Previous coronavirus
infection.

EXAM:
PORTABLE CHEST 1 VIEW

[chest ap]
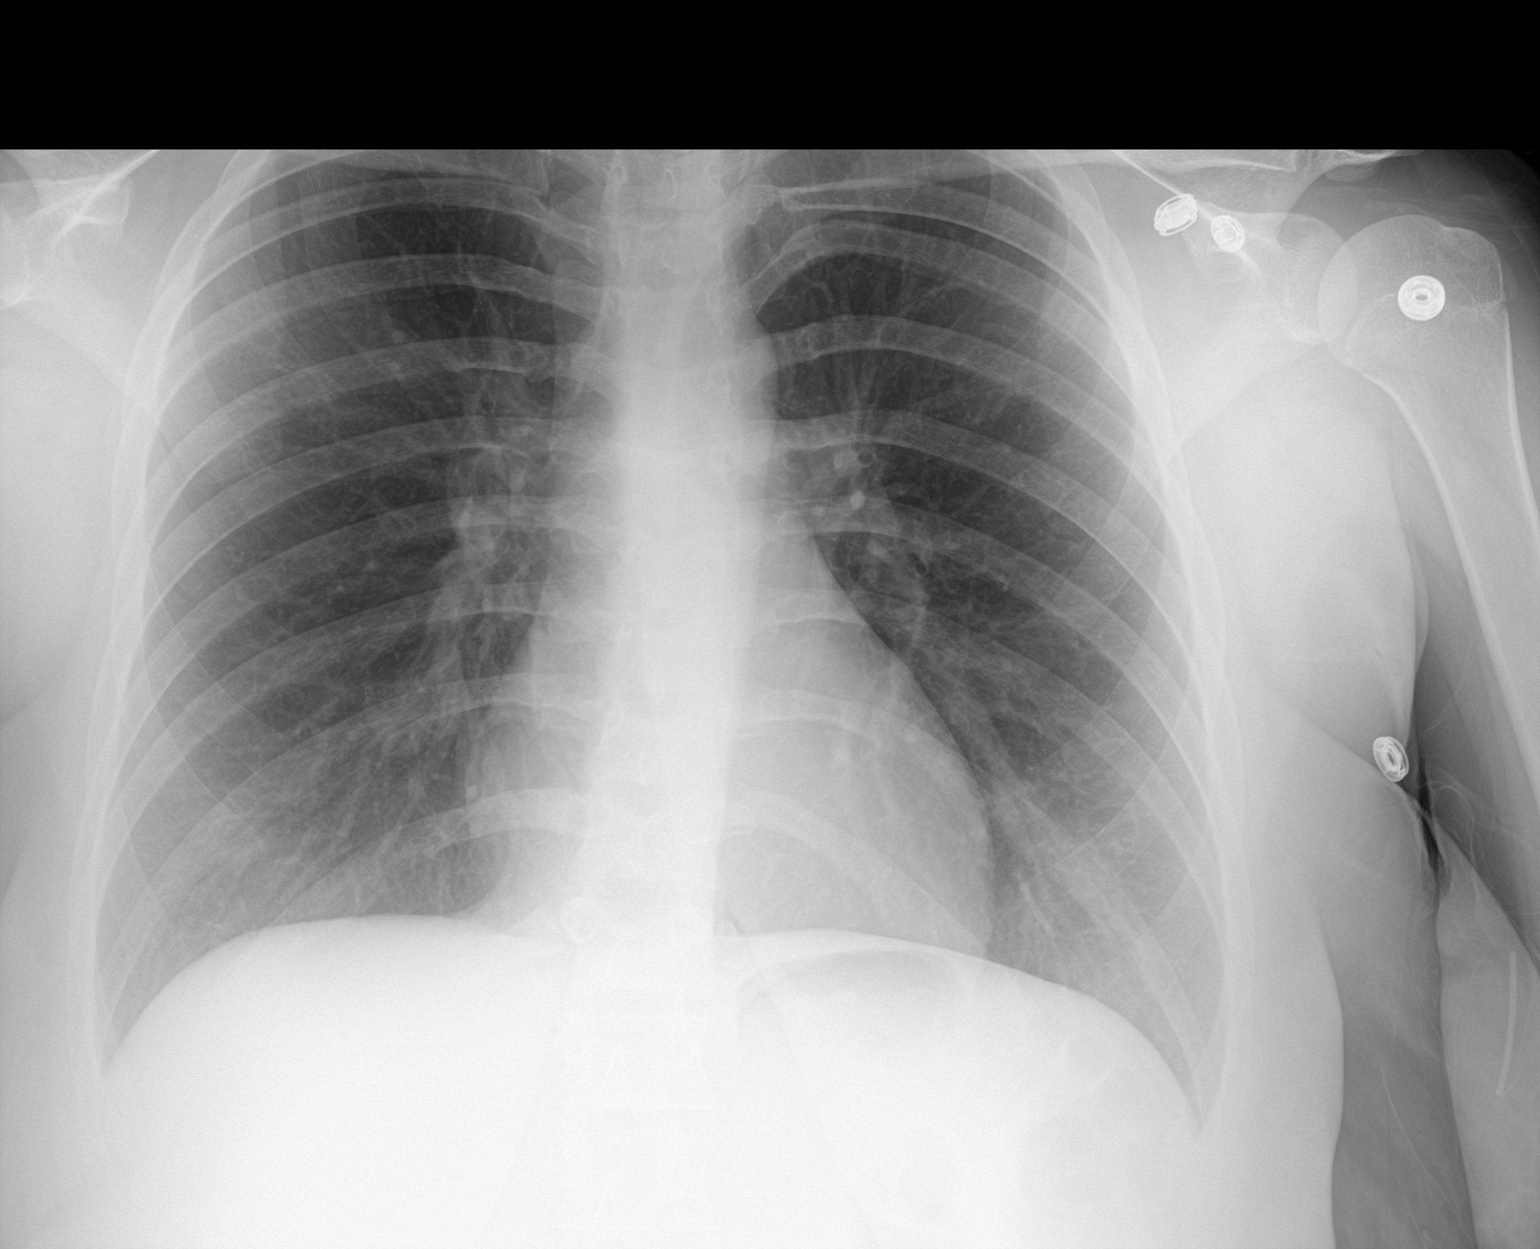

[1 of 1 positions shown; findings below may reference images not displayed]

FINDINGS: The heart size and mediastinal contours are within normal limits.
Both lungs are clear. The visualized skeletal structures are
unremarkable.
IMPRESSION: No active disease.

## 2020-10-21 ENCOUNTER — Encounter (HOSPITAL_BASED_OUTPATIENT_CLINIC_OR_DEPARTMENT_OTHER): Payer: Self-pay

## 2020-10-21 DIAGNOSIS — G4719 Other hypersomnia: Secondary | ICD-10-CM

## 2020-10-21 DIAGNOSIS — G4721 Circadian rhythm sleep disorder, delayed sleep phase type: Secondary | ICD-10-CM

## 2020-10-21 DIAGNOSIS — G478 Other sleep disorders: Secondary | ICD-10-CM

## 2020-12-07 ENCOUNTER — Other Ambulatory Visit: Payer: Self-pay

## 2020-12-07 ENCOUNTER — Ambulatory Visit (HOSPITAL_BASED_OUTPATIENT_CLINIC_OR_DEPARTMENT_OTHER): Payer: BC Managed Care – PPO | Attending: Internal Medicine | Admitting: Internal Medicine

## 2020-12-07 VITALS — Ht 68.0 in | Wt 170.0 lb

## 2020-12-07 DIAGNOSIS — G4719 Other hypersomnia: Secondary | ICD-10-CM | POA: Diagnosis present

## 2020-12-07 DIAGNOSIS — G478 Other sleep disorders: Secondary | ICD-10-CM

## 2020-12-07 DIAGNOSIS — Z79899 Other long term (current) drug therapy: Secondary | ICD-10-CM | POA: Insufficient documentation

## 2020-12-07 DIAGNOSIS — G4721 Circadian rhythm sleep disorder, delayed sleep phase type: Secondary | ICD-10-CM

## 2020-12-08 ENCOUNTER — Ambulatory Visit (HOSPITAL_BASED_OUTPATIENT_CLINIC_OR_DEPARTMENT_OTHER): Payer: BC Managed Care – PPO | Attending: Internal Medicine | Admitting: Internal Medicine

## 2020-12-08 VITALS — Ht 68.0 in | Wt 170.0 lb

## 2020-12-08 DIAGNOSIS — G4711 Idiopathic hypersomnia with long sleep time: Secondary | ICD-10-CM | POA: Diagnosis not present

## 2020-12-08 DIAGNOSIS — Z79899 Other long term (current) drug therapy: Secondary | ICD-10-CM | POA: Insufficient documentation

## 2020-12-08 DIAGNOSIS — R400 Somnolence: Secondary | ICD-10-CM | POA: Diagnosis present

## 2020-12-09 ENCOUNTER — Other Ambulatory Visit: Payer: Self-pay

## 2020-12-09 ENCOUNTER — Other Ambulatory Visit (HOSPITAL_BASED_OUTPATIENT_CLINIC_OR_DEPARTMENT_OTHER): Payer: Self-pay

## 2020-12-09 DIAGNOSIS — G4721 Circadian rhythm sleep disorder, delayed sleep phase type: Secondary | ICD-10-CM

## 2020-12-09 DIAGNOSIS — G478 Other sleep disorders: Secondary | ICD-10-CM

## 2020-12-09 DIAGNOSIS — G4719 Other hypersomnia: Secondary | ICD-10-CM

## 2020-12-16 NOTE — Procedures (Signed)
    NAME: Donna Leon DATE OF BIRTH:  [birth year] MEDICAL RECORD NUMBER [id number]  LOCATION: [facility]  PHYSICIAN: Deretha Emory  DATE OF STUDY: 12/08/2020  SLEEP STUDY TYPE: Out of Center Sleep Test                REFERRING PHYSICIAN: Deretha Emory, MD  EPWORTH SLEEPINESS SCORE:  17 HEIGHT: 5\' 8"  (172.7 cm)  WEIGHT: 170 lb (77.1 kg)    Body mass index is 25.85 kg/m.  NECK SIZE: 13.5 in.  CLINICAL INFORMATION The patient was referred to the sleep center for evaluation of excessive daytime sleepiness. HSAT and PSG are negative. She is on fluoxetine which could not be discontinued due to OCD  MEDICATIONS Patient self administered medications include: PROTONIX, PROZAC  SLEEP STUDY TECHNIQUE A multiple sleep latency test was performed. The channels recorded and monitored were central and occipital EEG, electrooculogram (EOG), submentalis EMG (chin), and electrocardiogram.  TECHNICAL COMMENTS Comments added by Technician: None Comments added by Scorer: N/A  IMPRESSIONS - Total number of naps attempted: 5 . Total number of naps with sleep attained: 5 - Pathologic sleepiness was evidenced by short mean sleep latency of 3:08 minutes - One sleep onset REMs present. This study is not consistent with narcolepsy, however, she is on fluoxetine which can suppress REM sleep.   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  Sleep specialist, American Board of Internal Medicine  ELECTRONICALLY SIGNED ON:  12/16/2020, 6:34 AM  [facility] PH: [phone]   FX: [phone] ACCREDITED BY THE AMERICAN ACADEMY OF SLEEP MEDICINE

## 2020-12-16 NOTE — Procedures (Signed)
   NAME: Donna Leon DATE OF BIRTH:  [birth year] MEDICAL RECORD NUMBER [id number]  LOCATION: [facility]  PHYSICIAN: Deretha Emory  DATE OF STUDY: 12/07/2020  SLEEP STUDY TYPE: Nocturnal Polysomnogram               REFERRING PHYSICIAN: Deretha Emory, MD  EPWORTH SLEEPINESS SCORE:  17 HEIGHT: 5\' 8"  (172.7 cm)  WEIGHT: 170 lb (77.1 kg)    Body mass index is 25.85 kg/m.  NECK SIZE: 13.5 in.  CLINICAL INFORMATION The patient was referred to the sleep center for evaluation severe excessive daytime sleepiness with negative home sleep apnea test. This test done to confirm negative HSAT and prepare for MSLT the next day. She has severe OCD and is on fluoxetine. The medication was not discontinued in this patient due to severity of her OCD  MEDICATIONS Patient self administered medications include: PROTONIX, PROZAC. Medications administered during study include No sleep medicine administered.  SLEEP STUDY TECHNIQUE A multi-channel overnight Polysomnography study was performed. The channels recorded and monitored were central and occipital EEG, electrooculogram (EOG), submentalis EMG (chin), nasal and oral airflow, thoracic and abdominal wall motion, anterior tibialis EMG, snore microphone, electrocardiogram, and a pulse oximetry.  TECHNICAL COMMENTS Comments added by Technician: Patient had difficulty initiating sleep. Patient was restless all through the night. Comments added by Scorer: N/A  SLEEP ARCHITECTURE The study was initiated at 11:15:53 PM and terminated at 5:59:27 AM. The total recorded time was 403.6 minutes. EEG confirmed total sleep time was 357 minutes yielding a sleep efficiency of 88.5%%. Sleep onset after lights out was 23.5 minutes with a REM latency of 188.0 minutes. The patient spent 2.7%% of the night in stage N1 sleep, 66.5%% in stage N2 sleep, 21.0%% in stage N3 and 9.8% in REM. Wake after sleep onset (WASO) was 23.1 minutes. The Arousal Index  was 21.5/hour.  RESPIRATORY PARAMETERS There were a total of 6 respiratory disturbances out of which 2 were apneas ( 1 obstructive, 0 mixed, 1 central) and 4 hypopneas. The apnea/hypopnea index (AHI) was 1.0 events/hour. The central sleep apnea index was 0.2 events/hour. The REM AHI was 1.7 events/hour and NREM AHI was 0.9 events/hour. The supine AHI was 2.2 events/hour and the non supine AHI was 0.3 events/hour. She was supine during 37.68% of sleep. Respiratory disturbance index was 1.7/hour overall and 5.1/hour in REM sleep. Respiratory disturbances were associated with oxygen desaturation down to a nadir of 91.0% during sleep. The mean oxygen saturation during the study was 95.5%. The cumulative time under 88% oxygen saturation was 0 minutes.  LEG MOVEMENT DATA The total leg movements were 0 with a resulting leg movement index of 0.0/hr . Associated arousal with leg movement index was 0.0/hr.  CARDIAC DATA The underlying cardiac rhythm was most consistent with sinus rhythm. Mean heart rate during sleep was 55.0 bpm. Additional rhythm abnormalities include None.  IMPRESSIONS - No Significant Obstructive Sleep Apnea (OSA) - No significant periodic leg movements(PLMs) during sleep.   DIAGNOSIS - Excessive daytime sleepiness.   RECOMMENDATIONS - May proceed with MSLT  Marland Kitchen Sleep specialist, American Board of Internal Medicine  ELECTRONICALLY SIGNED ON:  12/16/2020, 6:32 AM [facility] PH: [phone]   FX: [phone] ACCREDITED BY THE AMERICAN ACADEMY OF SLEEP MEDICINE
